# Patient Record
Sex: Female | Born: 1995 | Race: White | Hispanic: No | Marital: Married | Smoking: Never smoker
Health system: Southern US, Community
[De-identification: ages and names within clinical notes are randomized; demographics above are authoritative.]

## PROBLEM LIST (undated history)

## (undated) DIAGNOSIS — F99 Mental disorder, not otherwise specified: Secondary | ICD-10-CM

## (undated) DIAGNOSIS — F419 Anxiety disorder, unspecified: Secondary | ICD-10-CM

## (undated) DIAGNOSIS — F32A Depression, unspecified: Secondary | ICD-10-CM

## (undated) DIAGNOSIS — Z789 Other specified health status: Secondary | ICD-10-CM

## (undated) HISTORY — DX: Other specified health status: Z78.9

## (undated) HISTORY — DX: Anxiety disorder, unspecified: F41.9

## (undated) HISTORY — PX: WISDOM TOOTH EXTRACTION: SHX21

## (undated) HISTORY — DX: Depression, unspecified: F32.A

## (undated) HISTORY — DX: Mental disorder, not otherwise specified: F99

---
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
## 2015-07-24 IMAGING — CR DG WRIST COMPLETE 3+V*L*
4 series · 4 of 4 positions shown · non-contrast
Comparison: None.

CLINICAL DATA: Fall, wrist pain

EXAM:
LEFT WRIST - COMPLETE 3+ VIEW

[x wrist pa left]
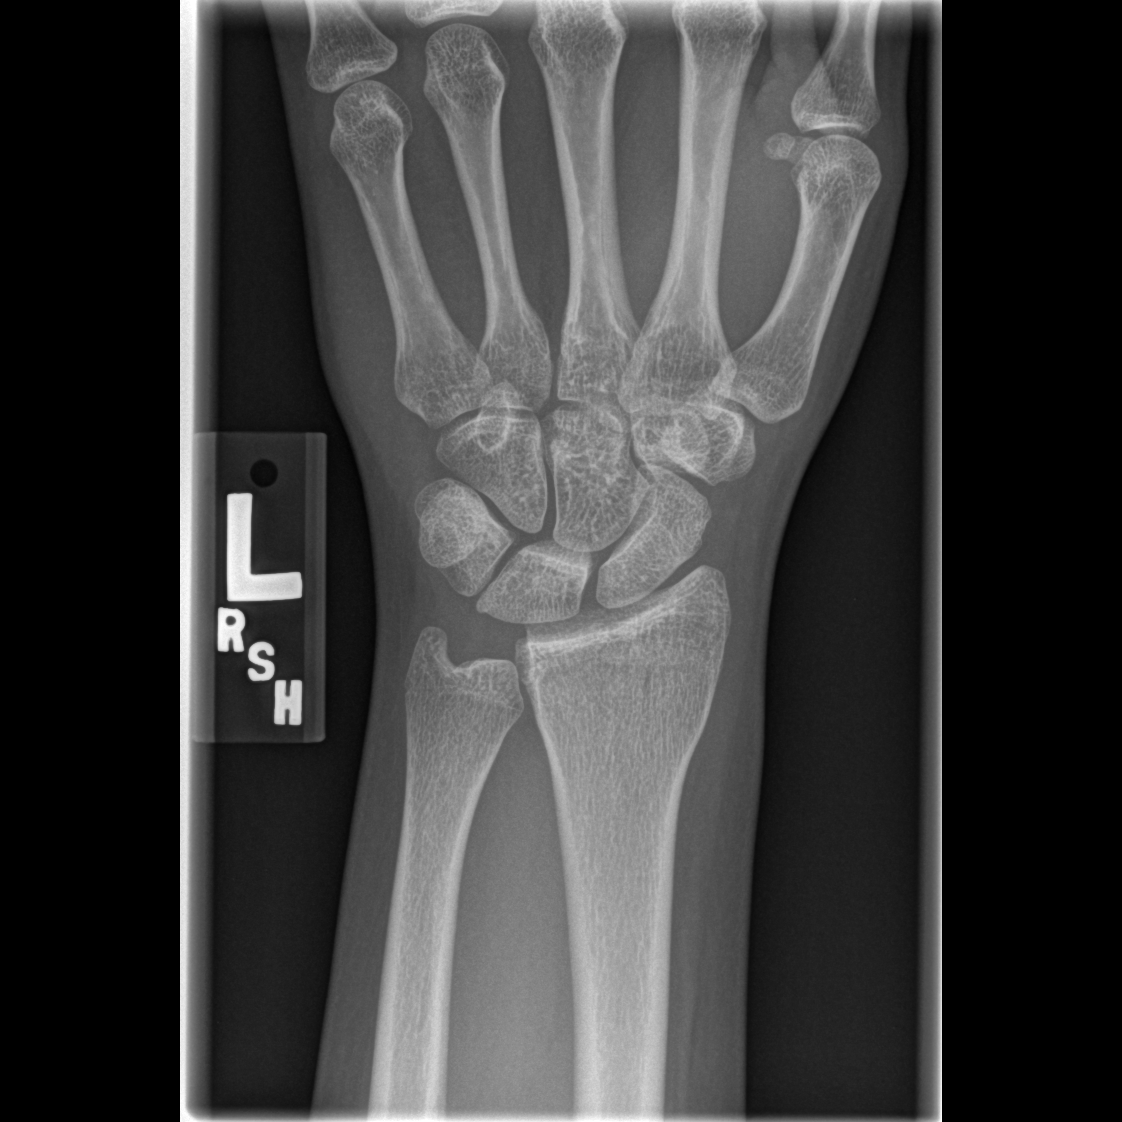

[x wrist obl left]
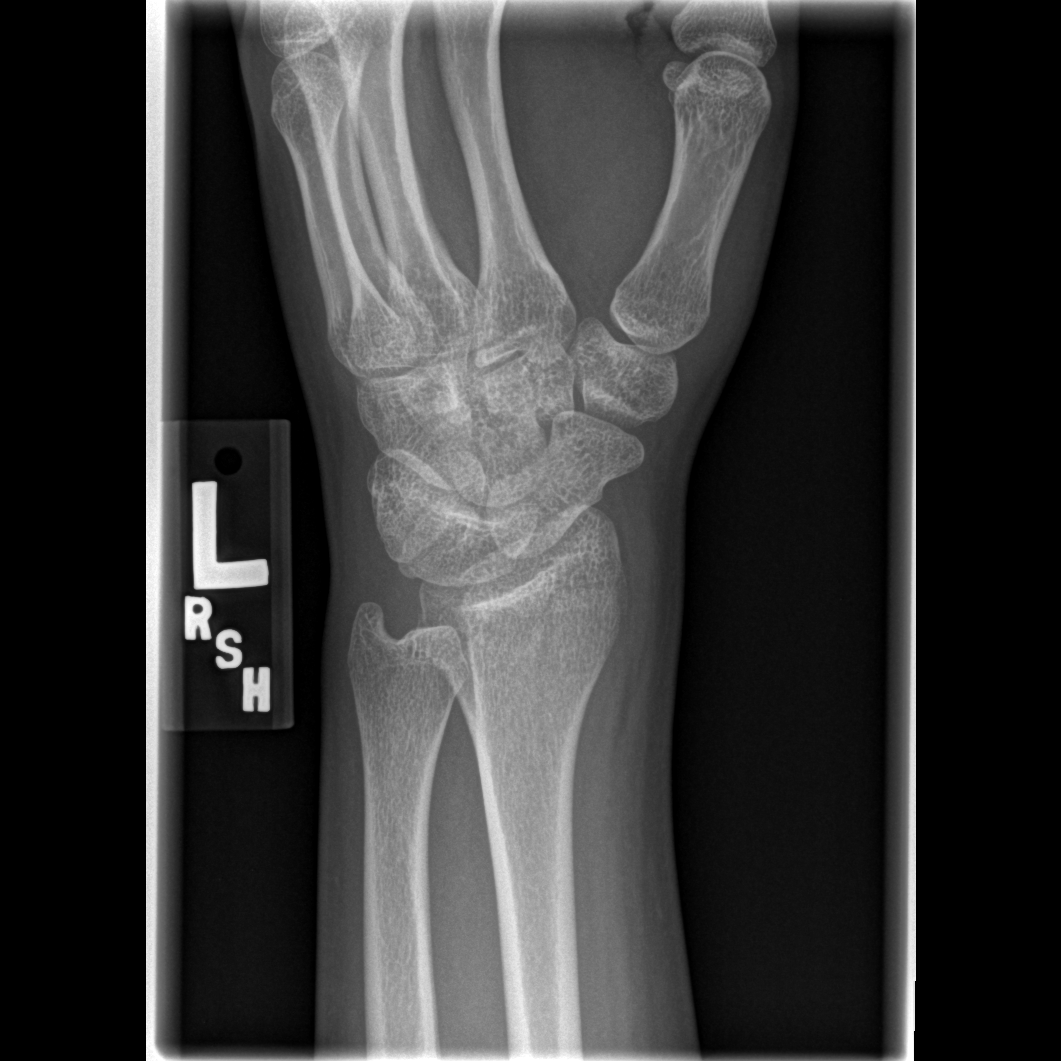

[x wrist lat left]
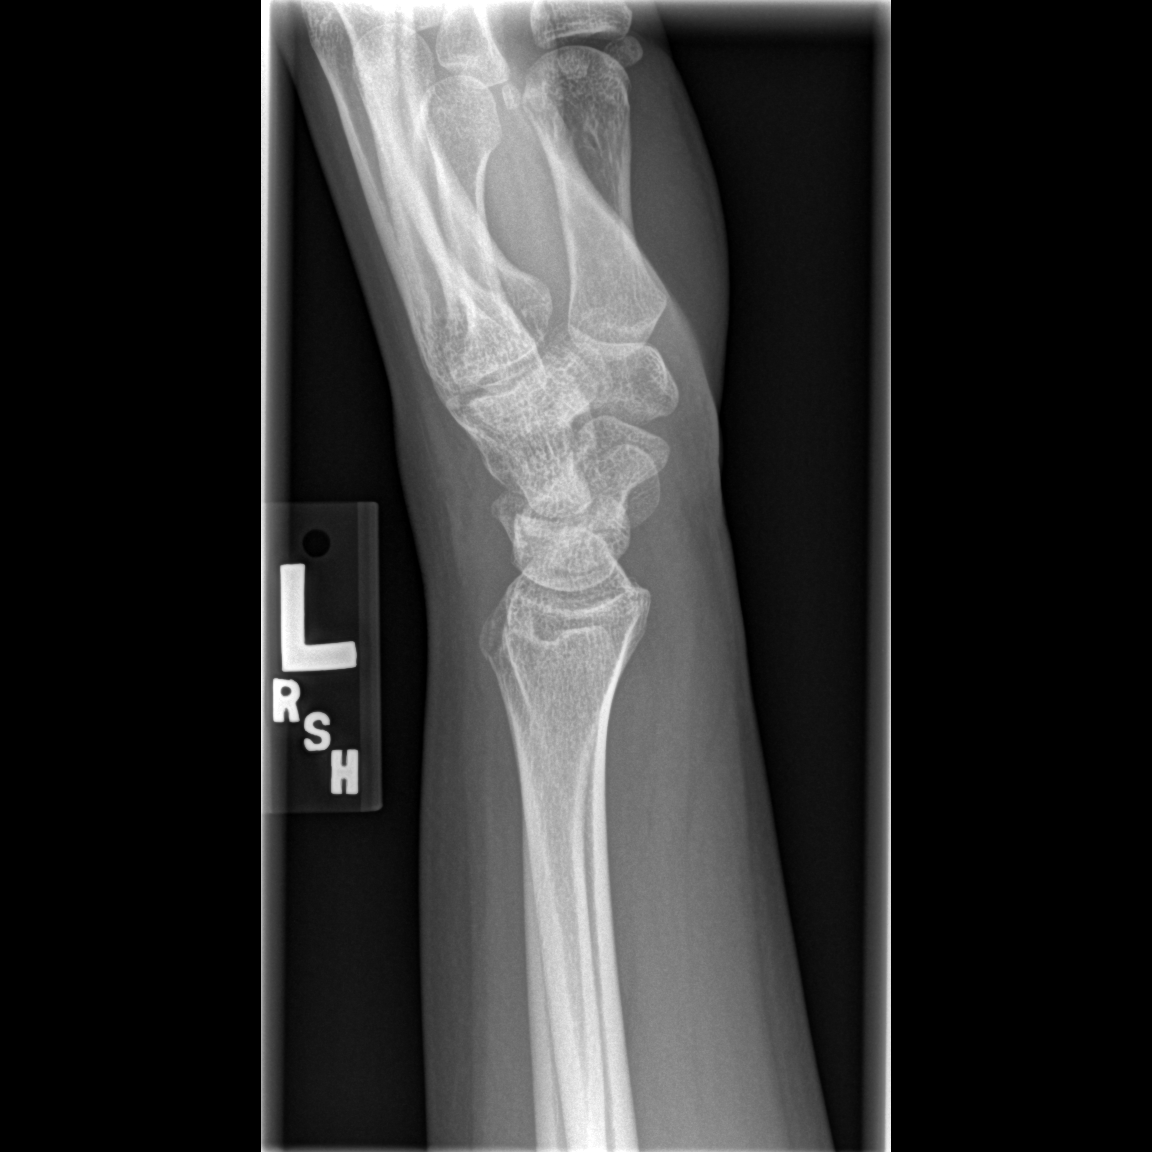

[x navicular]
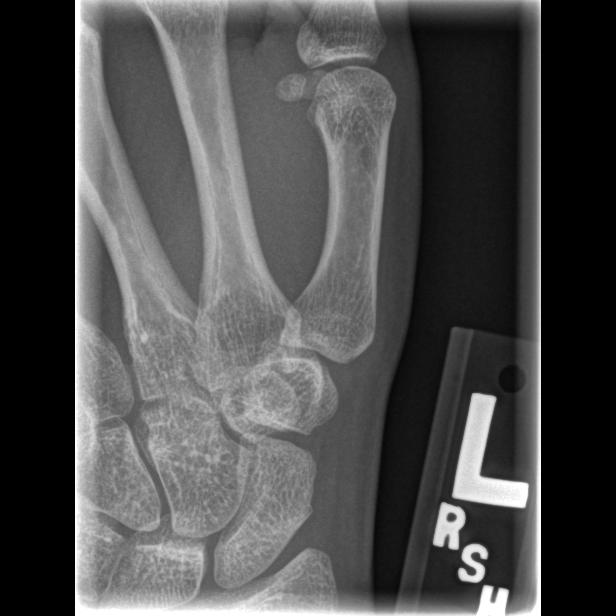

[4 of 4 positions shown; findings below may reference images not displayed]

FINDINGS: Subtle lucency in the distal radius. On the lateral view, there
appears to be disruption of the cortex. High suspicion for
nondisplaced distal radius fracture. There is associated mild soft
tissue swelling with loss of the normal approach [place] quadrant is
fat pad the carpus is intact and congruent. No scaphoid fracture
identified.
IMPRESSION: Subtle, nondisplaced fracture along the dorsal aspect of the distal
radius.

## 2015-11-06 ENCOUNTER — Encounter (HOSPITAL_COMMUNITY): Payer: Self-pay

## 2015-11-06 ENCOUNTER — Emergency Department (HOSPITAL_COMMUNITY)
Admission: EM | Admit: 2015-11-06 | Discharge: 2015-11-06 | Disposition: A | Discharge status: Home / Self Care | Payer: BLUE CROSS/BLUE SHIELD | Attending: Physician Assistant | Admitting: Physician Assistant

## 2015-11-06 DIAGNOSIS — O210 Mild hyperemesis gravidarum: Secondary | ICD-10-CM | POA: Diagnosis not present

## 2015-11-06 DIAGNOSIS — Z3A12 12 weeks gestation of pregnancy: Secondary | ICD-10-CM | POA: Insufficient documentation

## 2015-11-06 DIAGNOSIS — O219 Vomiting of pregnancy, unspecified: Secondary | ICD-10-CM | POA: Diagnosis present

## 2015-11-06 LAB — COMPREHENSIVE METABOLIC PANEL
ALT: 22 U/L (ref 14–54)
ANION GAP: 11 (ref 5–15)
AST: 29 U/L (ref 15–41)
Albumin: 4.1 g/dL (ref 3.5–5.0)
Alkaline Phosphatase: 47 U/L (ref 38–126)
BUN: 5 mg/dL — ABNORMAL LOW (ref 6–20)
CHLORIDE: 107 mmol/L (ref 101–111)
CO2: 16 mmol/L — ABNORMAL LOW (ref 22–32)
Calcium: 9.2 mg/dL (ref 8.9–10.3)
Creatinine, Ser: 0.6 mg/dL (ref 0.44–1.00)
Glucose, Bld: 75 mg/dL (ref 65–99)
POTASSIUM: 3.8 mmol/L (ref 3.5–5.1)
Sodium: 134 mmol/L — ABNORMAL LOW (ref 135–145)
TOTAL PROTEIN: 7.3 g/dL (ref 6.5–8.1)
Total Bilirubin: 1.1 mg/dL (ref 0.3–1.2)

## 2015-11-06 LAB — I-STAT BETA HCG BLOOD, ED (MC, WL, AP ONLY): I-stat hCG, quantitative: >2000 m[IU]/mL — ABNORMAL HIGH (ref <5)

## 2015-11-06 LAB — URINALYSIS, ROUTINE W REFLEX MICROSCOPIC
Bilirubin Urine: NEGATIVE
GLUCOSE, UA: NEGATIVE mg/dL
Hgb urine dipstick: NEGATIVE
Ketones, ur: >80 mg/dL — AB
LEUKOCYTES UA: NEGATIVE
NITRITE: NEGATIVE
PROTEIN: 30 mg/dL — AB
Specific Gravity, Urine: 1.029 (ref 1.005–1.030)
pH: 6 (ref 5.0–8.0)

## 2015-11-06 LAB — URINE MICROSCOPIC-ADD ON: RBC / HPF: NONE SEEN RBC/hpf (ref 0–5)

## 2015-11-06 LAB — CBC
HEMATOCRIT: 38.9 % (ref 36.0–46.0)
Hemoglobin: 14.5 g/dL (ref 12.0–15.0)
MCH: 31 pg (ref 26.0–34.0)
MCHC: 37.3 g/dL — ABNORMAL HIGH (ref 30.0–36.0)
MCV: 83.1 fL (ref 78.0–100.0)
Platelets: 215 10*3/uL (ref 150–400)
RBC: 4.68 MIL/uL (ref 3.87–5.11)
RDW: 12.3 % (ref 11.5–15.5)
WBC: 6.6 10*3/uL (ref 4.0–10.5)

## 2015-11-06 LAB — LIPASE, BLOOD: LIPASE: 24 U/L (ref 11–51)

## 2015-11-06 MED ORDER — LACTATED RINGERS IV BOLUS (SEPSIS)
1000.0000 mL | Freq: Once | INTRAVENOUS | Status: AC
  Administered 2015-11-06: 1000 mL via INTRAVENOUS

## 2015-11-06 MED ORDER — METOCLOPRAMIDE HCL 5 MG/ML IJ SOLN
10.0000 mg | Freq: Once | INTRAMUSCULAR | Status: AC
  Administered 2015-11-06: 10 mg via INTRAVENOUS
  Filled 2015-11-06: qty 2

## 2015-11-06 MED ORDER — DEXTROSE 5 % IN LACTATED RINGERS IV BOLUS
1000.0000 mL | Freq: Once | INTRAVENOUS | Status: AC
  Administered 2015-11-06: 1000 mL via INTRAVENOUS

## 2015-11-06 MED ORDER — FAMOTIDINE IN NACL 20-0.9 MG/50ML-% IV SOLN
20.0000 mg | Freq: Once | INTRAVENOUS | Status: AC
  Administered 2015-11-06: 20 mg via INTRAVENOUS
  Filled 2015-11-06: qty 50

## 2015-11-06 MED ORDER — PROMETHAZINE HCL 25 MG RE SUPP: 25.0000 mg | Freq: Four times a day (QID) | RECTAL | 0 refills | Status: DC | PRN

## 2015-11-06 NOTE — ED Notes (Signed)
Declined W/C at D/C and was escorted to lobby by RN. 

## 2015-11-06 NOTE — ED Notes (Signed)
In Fast Track hallway.

## 2015-11-06 NOTE — Discharge Instructions (Signed)
Use 1/2 suppository to start with for nausea/vomiting. Follow up with your OB, return here as needed.

## 2015-11-06 NOTE — ED Triage Notes (Addendum)
Pt is [redacted] weeks pregnant. Reports vomiting 3-4 x a day for several weeks. OBGYN prescribed nausea meds w/o relief of symptoms. Pt unable to tolerate PO food/fluids, feels lightheaded. Pt told to come to ED for further evaluation by OBGYN. Denies abd pain. Pt states she thinks she may have UTI b/c of burning w/ urination.

## 2015-11-06 NOTE — ED Provider Notes (Signed)
[facility]-EMERGENCY DEPT Provider Note   CSN: [id number] Arrival date & time: 11/06/15  1218     History   Chief Complaint Chief Complaint  Patient presents with  . Morning Sickness    HPI Jacqueline Johnson is a 20 y.o. G1P0 @ 2262w4d gestation who presents to the ED with nausea and vomiting in early pregnancy. Patient reports that she has had n/v since she has been pregnant but symptoms have been worse over the past 3 days. [place]. She has been on Phenergan tablets but vomits them, she has used Diclegis but it doesn't work. She also use OTC nausea medication without relief. Patient has lost 17 pound since she has been pregnant. She had an appointment today but she felt to bad to go. Patient complains of feeling dizzy.  The history is provided by the patient. No language interpreter was used.  Emesis   This is a new problem. The problem occurs 2 to 4 times per day. The emesis has an appearance of stomach contents. There has been no fever. Associated symptoms include cough. Pertinent negatives include no abdominal pain, no fever and no headaches.    History reviewed. No pertinent past medical history.  There are no active problems to display for this patient.   History reviewed. No pertinent surgical history.  OB History    Gravida Para Term Preterm AB Living   1             SAB TAB Ectopic Multiple Live Births                   Home Medications    Prior to Admission medications   Medication Sig Start Date End Date Taking? Authorizing Provider  Norethindrone-Ethinyl Estradiol-Fe Biphas (LO LOESTRIN FE) 1 MG-10 MCG / 10 MCG tablet Take 1 tablet by mouth daily.    Historical Provider, MD  promethazine (PHENERGAN) 25 MG suppository Place 1 suppository (25 mg total) rectally every 6 (six) hours as needed for nausea or vomiting. 11/06/15   Hope Orlene OchM Neese, NP  traMADol (ULTRAM) 50 MG tablet Take 1 tablet (50 mg total) by mouth every 6 (six) hours as  needed. 01/03/13   Emilia BeckKaitlyn Szekalski, PA-C    Family History History reviewed. No pertinent family history.  Social History Social History  Substance Use Topics  . Smoking status: Never Smoker  . Smokeless tobacco: Never Used  . Alcohol use No     Allergies   Review of patient's allergies indicates no known allergies.   Review of Systems Review of Systems  Constitutional: Negative for fever.  HENT: Negative.   Eyes: Negative for visual disturbance.  Respiratory: Positive for cough. Negative for chest tightness.   Cardiovascular: Negative for chest pain.  Gastrointestinal: Positive for vomiting. Negative for abdominal pain.  Genitourinary: Negative for frequency, urgency, vaginal bleeding and vaginal discharge.  Musculoskeletal: Negative for back pain.  Skin: Negative for rash.  Neurological: Positive for light-headedness. Negative for syncope and headaches.  Psychiatric/Behavioral: The patient is not nervous/anxious.      Physical Exam Updated Vital Signs BP 123/79 (BP Location: Right Arm)   Pulse 91   Temp 98.2 F (36.8 C) (Oral)   Resp 16   Ht 5\' 4"  (1.626 m)   Wt 58.1 kg   SpO2 100%   BMI 21.97 kg/m   Physical Exam  Constitutional: She is oriented to person, place, and time. She appears well-developed and well-nourished.  HENT:  Head: Normocephalic  and atraumatic.  Eyes: EOM are normal.  Neck: Normal range of motion. Neck supple.  Cardiovascular: Normal rate.   Pulmonary/Chest: Effort normal.  Abdominal: Soft. Bowel sounds are normal. There is no tenderness.  Positive FHT's  Musculoskeletal: Normal range of motion.  Neurological: She is alert and oriented to person, place, and time. No cranial nerve deficit.  Skin: Skin is warm and dry.  Psychiatric: She has a normal mood and affect. Her behavior is normal.  Nursing note and vitals reviewed.    ED Treatments / Results  Labs (all labs ordered are listed, but only abnormal results are  displayed) Labs Reviewed  COMPREHENSIVE METABOLIC PANEL - Abnormal; Notable for the following:       Result Value   Sodium 134 (*)    CO2 16 (*)    BUN 5 (*)    All other components within normal limits  CBC - Abnormal; Notable for the following:    MCHC 37.3 (*)    All other components within normal limits  URINALYSIS, ROUTINE W REFLEX MICROSCOPIC (NOT AT [facility]) - Abnormal; Notable for the following:    APPearance CLOUDY (*)    Ketones, ur >80 (*)    Protein, ur 30 (*)    All other components within normal limits  URINE MICROSCOPIC-ADD ON - Abnormal; Notable for the following:    Squamous Epithelial / LPF 6-30 (*)    Bacteria, UA RARE (*)    Casts HYALINE CASTS (*)    All other components within normal limits  I-STAT BETA HCG BLOOD, ED ([facility], [facility], [facility] ONLY) - Abnormal; Notable for the following:    I-stat hCG, quantitative >2,000.0 (*)    All other components within normal limits  LIPASE, BLOOD     Radiology No results found.  Procedures Procedures (including critical care time)  Medications Ordered in ED Medications  lactated ringers bolus 1,000 mL (0 mLs Intravenous Stopped 11/06/15 1703)  metoCLOPramide (REGLAN) injection 10 mg (10 mg Intravenous Given 11/06/15 1503)  famotidine (PEPCID) IVPB 20 mg premix (0 mg Intravenous Stopped 11/06/15 1722)  dextrose 5% lactated ringers bolus 1,000 mL (0 mLs Intravenous Stopped 11/06/15 1722)     Initial Impression / Assessment and Plan / ED Course  I have reviewed the triage vital signs and the nursing notes.  Pertinent lab results that were available during my care of the patient were reviewed by me and considered in my medical decision making (see chart for details).  Clinical Course    Final Clinical Impressions(s) / ED Diagnoses  20 y.o. female with n/v in pregnancy stable for d/c without abdominal pain, vaginal bleeding or other problems. Patient feeling much better after IV hydration and medication for nausea. Will give  Rx for phenergan suppositories.  She will f/u with her OB or go to the [facility] if symptoms return.  Final diagnoses:  Hyperemesis gravidarum    New Prescriptions Discharge Medication List as of 11/06/2015  4:56 PM    START taking these medications   Details  promethazine (PHENERGAN) 25 MG suppository Place 1 suppository (25 mg total) rectally every 6 (six) hours as needed for nausea or vomiting., Starting Tue 11/06/2015, Print         Janne Napoleon, NP 11/08/15 1512    Courteney Randall An, MD 11/14/15 [phone]

## 2015-11-06 NOTE — ED Triage Notes (Signed)
Pt drinking a soda and tol. well

## 2016-07-16 ENCOUNTER — Encounter: Payer: Self-pay | Admitting: Podiatry

## 2016-07-16 ENCOUNTER — Ambulatory Visit (INDEPENDENT_AMBULATORY_CARE_PROVIDER_SITE_OTHER): Payer: BLUE CROSS/BLUE SHIELD | Admitting: Podiatry

## 2016-07-16 ENCOUNTER — Telehealth: Payer: Self-pay | Admitting: *Deleted

## 2016-07-16 DIAGNOSIS — L03032 Cellulitis of left toe: Secondary | ICD-10-CM | POA: Diagnosis not present

## 2016-07-16 DIAGNOSIS — L600 Ingrowing nail: Secondary | ICD-10-CM

## 2016-07-16 NOTE — Telephone Encounter (Signed)
Pt states just had an ingrown toenail procedure and wanted to know if she could swim. I told pt that she could not swim until the area had no broken skin, redness, swelling or drainage. Pt states understanding.

## 2016-07-16 NOTE — Progress Notes (Signed)
   Subjective:    Patient ID: Jacqueline Johnson, female    DOB: [birth year], 21 y.o.   MRN: [id number]  HPI this patient presents the office with chief complaint of a painful outside border the big toe left foot. She says this toe has become painful over the last week and is more painful than her right big toe. She gives a history of having an infected ingrown toenail on the right big toe that was treated with antibiotics by her medical doctor and the infection has resolved.  She now presents to this office saying she is pain and discomfort and even pus along the outside border the big toe of the left foot. She presents the office today for definitive evaluation and treatment of this condition    Review of Systems  All other systems reviewed and are negative.      Objective:   Physical Exam GENERAL APPEARANCE: Alert, conversant. Appropriately groomed. No acute distress.  VASCULAR: Pedal pulses are  palpable at  [facility] bilateral.  Capillary refill time is immediate to all digits,  Normal temperature gradient.  Digital hair growth is present bilateral  NEUROLOGIC: sensation is normal to 5.07 monofilament at 5/5 sites bilateral.  Light touch is intact bilateral, Muscle strength normal.  MUSCULOSKELETAL: acceptable muscle strength, tone and stability bilateral.  Intrinsic muscluature intact bilateral.  Rectus appearance of foot and digits noted bilateral.  NAILS  marked incurvation noted on the lateral border of the left great toe with pus noted along the nail groove. No infection in the right hallux except it does have the start of a pincer toenail DERMATOLOGIC: skin color, texture, and turgor are within normal limits.  No preulcerative lesions or ulcers  are seen, no interdigital maceration noted.  No open lesions present.  . No drainage noted.         Assessment & Plan:  Paronychia lateral left great toe.  Ingrown toenail.   IE  Nail surgery.  Treatment options and alternatives discussed.   Recommended permanent phenol matrixectomy and patient agreed.  Left hallux  was prepped with alcohol and a toe block of 3cc of 2% lidocaine plain was administered in a digital toe block. .  The toe was then prepped with betadine solution .  The offending nail border was then excised and matrix tissue exposed.  Phenol was then applied to the matrix tissue followed by an alcohol wash.  Antibiotic ointment and a dry sterile dressing was applied.  The patient was dispensed instructions for aftercare. RTC 1 week.     Helane GuntherGregory Yasuko Lapage DPM

## 2016-07-23 ENCOUNTER — Encounter: Payer: Self-pay | Admitting: Podiatry

## 2016-07-23 ENCOUNTER — Ambulatory Visit (INDEPENDENT_AMBULATORY_CARE_PROVIDER_SITE_OTHER): Payer: BLUE CROSS/BLUE SHIELD | Admitting: Podiatry

## 2016-07-23 DIAGNOSIS — Z09 Encounter for follow-up examination after completed treatment for conditions other than malignant neoplasm: Secondary | ICD-10-CM

## 2016-07-23 NOTE — Progress Notes (Signed)
This patient returns to the office following nail surgery one week ago.  The patient says toe has been soaked and bandaged as directed.  There has been improvement of the toe since the surgery has been performed. The patient presents for continued evaluation and treatment.  GENERAL APPEARANCE: Alert, conversant. Appropriately groomed. No acute distress.  VASCULAR: Pedal pulses palpable at  DP and PT bilateral.  Capillary refill time is immediate to all digits,  Normal temperature gradient.    NEUROLOGIC: sensation is normal to 5.07 monofilament at 5/5 sites bilateral.  Light touch is intact bilateral, Muscle strength normal.  MUSCULOSKELETAL: acceptable muscle strength, tone and stability bilateral.  Intrinsic muscluature intact bilateral.  Rectus appearance of foot and digits noted bilateral.   DERMATOLOGIC: skin color, texture, and turgor are within normal limits.  No preulcerative lesions or ulcers  are seen, no interdigital maceration noted.   NAILS  There is necrotic tissue along the nail groove  In the absence of redness swelling and pain.  DX  S/p nail surgery  ROV  Home instructions were discussed.  Patient to call the office if there are any questions or concerns.   Sharnese Heath DPM   

## 2016-07-29 ENCOUNTER — Ambulatory Visit: Payer: BLUE CROSS/BLUE SHIELD | Admitting: Podiatry

## 2016-09-30 ENCOUNTER — Encounter (HOSPITAL_COMMUNITY): Payer: Self-pay

## 2016-10-01 ENCOUNTER — Ambulatory Visit: Payer: BLUE CROSS/BLUE SHIELD | Admitting: Podiatry

## 2016-10-15 ENCOUNTER — Encounter: Payer: Self-pay | Admitting: Podiatry

## 2016-10-15 ENCOUNTER — Ambulatory Visit (INDEPENDENT_AMBULATORY_CARE_PROVIDER_SITE_OTHER): Payer: BLUE CROSS/BLUE SHIELD | Admitting: Podiatry

## 2016-10-15 DIAGNOSIS — L600 Ingrowing nail: Secondary | ICD-10-CM | POA: Diagnosis not present

## 2016-10-19 NOTE — Progress Notes (Signed)
   Subjective: Patient presents today for evaluation of intermittent pain to the lateral border of the right great toe that began about 4 years ago. She reports associated redness and swelling of the area. Patient is concerned for possible ingrown nail. Patient presents today for further treatment and evaluation.   No past medical history on file.    Objective:  General: Well developed, nourished, in no acute distress, alert and oriented x3   Dermatology: Skin is warm, dry and supple bilateral. Lateral border of the right great toe appears to be erythematous with evidence of an ingrowing nail. Pain on palpation noted to the border of the nail fold. The remaining nails appear unremarkable at this time. There are no open sores, lesions.  Vascular: Dorsalis Pedis artery and Posterior Tibial artery pedal pulses palpable. No lower extremity edema noted.   Neruologic: Grossly intact via light touch bilateral.  Musculoskeletal: Muscular strength within normal limits in all groups bilateral. Normal range of motion noted to all pedal and ankle joints.   Assesement: #1 Paronychia with ingrowing nail lateral border of the right great toe #2 Pain in toe #3 Incurvated nail  Plan of Care:  1. Patient evaluated.  2. Discussed treatment alternatives and plan of care. Explained nail avulsion procedure and post procedure course to patient. 3. Patient opted for permanent partial nail avulsion.  4. Prior to procedure, local anesthesia infiltration utilized using 3 ml of a 50:50 mixture of 2% plain lidocaine and 0.5% plain marcaine in a normal hallux block fashion and a betadine prep performed.  5. Partial permanent nail avulsion with chemical matrixectomy performed using 3x30sec applications of phenol followed by alcohol flush.  6. Light dressing applied. 7. Return to clinic in 2 weeks.   Felecia Shelling, DPM [facility]  Dr. Felecia Shelling, DPM    [address]                                         [place], [place] [address]                Office [phone]  Fax [phone]

## 2016-10-29 ENCOUNTER — Ambulatory Visit (INDEPENDENT_AMBULATORY_CARE_PROVIDER_SITE_OTHER): Payer: BLUE CROSS/BLUE SHIELD | Admitting: Podiatry

## 2016-10-29 DIAGNOSIS — L600 Ingrowing nail: Secondary | ICD-10-CM | POA: Diagnosis not present

## 2016-10-29 MED ORDER — GENTAMICIN SULFATE 0.1 % EX CREA: 1.0000 "application " | TOPICAL_CREAM | Freq: Three times a day (TID) | CUTANEOUS | 1 refills | Status: DC

## 2016-11-01 NOTE — Progress Notes (Signed)
   Subjective: Patient presents today 2 weeks post ingrown nail permanent nail avulsion procedure. Patient states that the toe and nail fold is feeling much better.  No past medical history on file.  Objective: Skin is warm, dry and supple. Nail and respective nail fold appears to be healing appropriately. Open wound to the associated nail fold with a granular wound base and moderate amount of fibrotic tissue. Minimal drainage noted. Mild erythema around the periungual region likely due to phenol chemical matricectomy.  Assessment: #1 postop permanent partial nail avulsion lateral border right great toe #2 open wound periungual nail fold of respective digit.   Plan of care: #1 patient was evaluated  #2 debridement of open wound was performed to the periungual border of the respective toe using a currette. Antibiotic ointment and Band-Aid was applied. #3 prescription for gentamicin cream given to patient. #4 patient is to return to clinic on a PRN  basis.   Felecia Shelling, DPM [facility]  Dr. Felecia Shelling, DPM    [address]                                        [place], [place] [address]                Office [phone]  Fax [phone]

## 2018-01-27 NOTE — L&D Delivery Note (Signed)
OB/GYN Faculty Practice Delivery Note  Taneka Espiritu is a 23 y.o. G2P1001 s/p NSVD at [redacted]w[redacted]d. She was admitted for SOL.   ROM: 3h 62m with meconium-stained fluid GBS Status: negative Maximum Maternal Temperature: 98.42F  Labor Progress: . Patient came in contracting and making cervical change spontaneously. She was admitted to labor and delivery and SROMed at 1145 hours with moderate meconium-stained fluid. She progressed to complete with pitocin augmentation.  Delivery Date/Time: 11/29/18, 1534 hours Delivery: Called to room and patient was complete and pushing. Head delivered ROA. There were two nuchal cords which were manually reduced, and a body cord which was delivered through. Shoulder and body delivered in usual fashion. Infant with spontaneous cry, placed on mother's abdomen, dried and stimulated. Cord clamped x 2 after 1-minute delay, and cut by FOB under my direct supervision. Cord blood drawn. Placenta delivered spontaneously with gentle cord traction. Fundus firm with massage and Pitocin. Labia, perineum, vagina, and cervix were inspected, and a left labial abrasion was noted (hemostatic and did not require repair).   Placenta: 3 vessel cord, intact Complications: none Lacerations: left labial abrasion, hemostatic EBL: 100 mL Analgesia: epidural  Postpartum Planning [x]  message to sent to schedule follow-up  [x]  vaccines UTD  Infant: female  APGARs 9&9  weight per medical record  Merilyn Baba, DO OB/GYN Fellow, Faculty Practice

## 2018-04-05 ENCOUNTER — Encounter: Payer: Self-pay | Admitting: Adult Health

## 2018-04-05 ENCOUNTER — Ambulatory Visit (INDEPENDENT_AMBULATORY_CARE_PROVIDER_SITE_OTHER): Payer: BLUE CROSS/BLUE SHIELD | Admitting: Adult Health

## 2018-04-05 VITALS — BP 104/56 | HR 94 | Ht 65.0 in | Wt 143.0 lb

## 2018-04-05 DIAGNOSIS — Z3201 Encounter for pregnancy test, result positive: Secondary | ICD-10-CM

## 2018-04-05 DIAGNOSIS — R110 Nausea: Secondary | ICD-10-CM

## 2018-04-05 DIAGNOSIS — O3680X0 Pregnancy with inconclusive fetal viability, not applicable or unspecified: Secondary | ICD-10-CM | POA: Insufficient documentation

## 2018-04-05 DIAGNOSIS — Z3A01 Less than 8 weeks gestation of pregnancy: Secondary | ICD-10-CM | POA: Insufficient documentation

## 2018-04-05 LAB — POCT URINE PREGNANCY: PREG TEST UR: POSITIVE — AB

## 2018-04-05 MED ORDER — PROMETHAZINE HCL 25 MG PO TABS: 25.0000 mg | ORAL_TABLET | Freq: Four times a day (QID) | ORAL | 2 refills | Status: DC | PRN

## 2018-04-05 NOTE — Patient Instructions (Signed)
First Trimester of Pregnancy  The first trimester of pregnancy is from week 1 until the end of week 13 (months 1 through 3). A week after a sperm fertilizes an egg, the egg will implant on the wall of the uterus. This embryo will begin to develop into a baby. Genes from you and your partner will form the baby. The female genes will determine whether the baby will be a boy or a girl. At 6-8 weeks, the eyes and face will be formed, and the heartbeat can be seen on ultrasound. At the end of 12 weeks, all the baby's organs will be formed.  Now that you are pregnant, you will want to do everything you can to have a healthy baby. Two of the most important things are to get good prenatal care and to follow your health care provider's instructions. Prenatal care is all the medical care you receive before the baby's birth. This care will help prevent, find, and treat any problems during the pregnancy and childbirth.  Body changes during your first trimester  Your body goes through many changes during pregnancy. The changes vary from woman to woman.   You may gain or lose a couple of pounds at first.   You may feel sick to your stomach (nauseous) and you may throw up (vomit). If the vomiting is uncontrollable, call your health care provider.   You may tire easily.   You may develop headaches that can be relieved by medicines. All medicines should be approved by your health care provider.   You may urinate more often. Painful urination may mean you have a bladder infection.   You may develop heartburn as a result of your pregnancy.   You may develop constipation because certain hormones are causing the muscles that push stool through your intestines to slow down.   You may develop hemorrhoids or swollen veins (varicose veins).   Your breasts may begin to grow larger and become tender. Your nipples may stick out more, and the tissue that surrounds them (areola) may become darker.   Your gums may bleed and may be  sensitive to brushing and flossing.   Dark spots or blotches (chloasma, mask of pregnancy) may develop on your face. This will likely fade after the baby is born.   Your menstrual periods will stop.   You may have a loss of appetite.   You may develop cravings for certain kinds of food.   You may have changes in your emotions from day to day, such as being excited to be pregnant or being concerned that something may go wrong with the pregnancy and baby.   You may have more vivid and strange dreams.   You may have changes in your hair. These can include thickening of your hair, rapid growth, and changes in texture. Some women also have hair loss during or after pregnancy, or hair that feels dry or thin. Your hair will most likely return to normal after your baby is born.  What to expect at prenatal visits  During a routine prenatal visit:   You will be weighed to make sure you and the baby are growing normally.   Your blood pressure will be taken.   Your abdomen will be measured to track your baby's growth.   The fetal heartbeat will be listened to between weeks 10 and 14 of your pregnancy.   Test results from any previous visits will be discussed.  Your health care provider may ask you:     How you are feeling.   If you are feeling the baby move.   If you have had any abnormal symptoms, such as leaking fluid, bleeding, severe headaches, or abdominal cramping.   If you are using any tobacco products, including cigarettes, chewing tobacco, and electronic cigarettes.   If you have any questions.  Other tests that may be performed during your first trimester include:   Blood tests to find your blood type and to check for the presence of any previous infections. The tests will also be used to check for low iron levels (anemia) and protein on red blood cells (Rh antibodies). Depending on your risk factors, or if you previously had diabetes during pregnancy, you may have tests to check for high blood sugar  that affects pregnant women (gestational diabetes).   Urine tests to check for infections, diabetes, or protein in the urine.   An ultrasound to confirm the proper growth and development of the baby.   Fetal screens for spinal cord problems (spina bifida) and Down syndrome.   HIV (human immunodeficiency virus) testing. Routine prenatal testing includes screening for HIV, unless you choose not to have this test.   You may need other tests to make sure you and the baby are doing well.  Follow these instructions at home:  Medicines   Follow your health care provider's instructions regarding medicine use. Specific medicines may be either safe or unsafe to take during pregnancy.   Take a prenatal vitamin that contains at least 600 micrograms (mcg) of folic acid.   If you develop constipation, try taking a stool softener if your health care provider approves.  Eating and drinking     Eat a balanced diet that includes fresh fruits and vegetables, whole grains, good sources of protein such as meat, eggs, or tofu, and low-fat dairy. Your health care provider will help you determine the amount of weight gain that is right for you.   Avoid raw meat and uncooked cheese. These carry germs that can cause birth defects in the baby.   Eating four or five small meals rather than three large meals a day may help relieve nausea and vomiting. If you start to feel nauseous, eating a few soda crackers can be helpful. Drinking liquids between meals, instead of during meals, also seems to help ease nausea and vomiting.   Limit foods that are high in fat and processed sugars, such as fried and sweet foods.   To prevent constipation:  ? Eat foods that are high in fiber, such as fresh fruits and vegetables, whole grains, and beans.  ? Drink enough fluid to keep your urine clear or pale yellow.  Activity   Exercise only as directed by your health care provider. Most women can continue their usual exercise routine during  pregnancy. Try to exercise for 30 minutes at least 5 days a week. Exercising will help you:  ? Control your weight.  ? Stay in shape.  ? Be prepared for labor and delivery.   Experiencing pain or cramping in the lower abdomen or lower back is a good sign that you should stop exercising. Check with your health care provider before continuing with normal exercises.   Try to avoid standing for long periods of time. Move your legs often if you must stand in one place for a long time.   Avoid heavy lifting.   Wear low-heeled shoes and practice good posture.   You may continue to have sex unless your health care   provider tells you not to.  Relieving pain and discomfort   Wear a good support bra to relieve breast tenderness.   Take warm sitz baths to soothe any pain or discomfort caused by hemorrhoids. Use hemorrhoid cream if your health care provider approves.   Rest with your legs elevated if you have leg cramps or low back pain.   If you develop varicose veins in your legs, wear support hose. Elevate your feet for 15 minutes, 3-4 times a day. Limit salt in your diet.  Prenatal care   Schedule your prenatal visits by the twelfth week of pregnancy. They are usually scheduled monthly at first, then more often in the last 2 months before delivery.   Write down your questions. Take them to your prenatal visits.   Keep all your prenatal visits as told by your health care provider. This is important.  Safety   Wear your seat belt at all times when driving.   Make a list of emergency phone numbers, including numbers for family, friends, the hospital, and police and fire departments.  General instructions   Ask your health care provider for a referral to a local prenatal education class. Begin classes no later than the beginning of month 6 of your pregnancy.   Ask for help if you have counseling or nutritional needs during pregnancy. Your health care provider can offer advice or refer you to specialists for help  with various needs.   Do not use hot tubs, steam rooms, or saunas.   Do not douche or use tampons or scented sanitary pads.   Do not cross your legs for long periods of time.   Avoid cat litter boxes and soil used by cats. These carry germs that can cause birth defects in the baby and possibly loss of the fetus by miscarriage or stillbirth.   Avoid all smoking, herbs, alcohol, and medicines not prescribed by your health care provider. Chemicals in these products affect the formation and growth of the baby.   Do not use any products that contain nicotine or tobacco, such as cigarettes and e-cigarettes. If you need help quitting, ask your health care provider. You may receive counseling support and other resources to help you quit.   Schedule a dentist appointment. At home, brush your teeth with a soft toothbrush and be gentle when you floss.  Contact a health care provider if:   You have dizziness.   You have mild pelvic cramps, pelvic pressure, or nagging pain in the abdominal area.   You have persistent nausea, vomiting, or diarrhea.   You have a bad smelling vaginal discharge.   You have pain when you urinate.   You notice increased swelling in your face, hands, legs, or ankles.   You are exposed to fifth disease or chickenpox.   You are exposed to German measles (rubella) and have never had it.  Get help right away if:   You have a fever.   You are leaking fluid from your vagina.   You have spotting or bleeding from your vagina.   You have severe abdominal cramping or pain.   You have rapid weight gain or loss.   You vomit blood or material that looks like coffee grounds.   You develop a severe headache.   You have shortness of breath.   You have any kind of trauma, such as from a fall or a car accident.  Summary   The first trimester of pregnancy is from week 1 until   the end of week 13 (months 1 through 3).   Your body goes through many changes during pregnancy. The changes vary from  woman to woman.   You will have routine prenatal visits. During those visits, your health care provider will examine you, discuss any test results you may have, and talk with you about how you are feeling.  This information is not intended to replace advice given to you by your health care provider. Make sure you discuss any questions you have with your health care provider.  Document Released: 01/07/2001 Document Revised: 12/26/2015 Document Reviewed: 12/26/2015  Elsevier Interactive Patient Education  2019 [employer]

## 2018-04-05 NOTE — Progress Notes (Signed)
Patient ID: Jacqueline Johnson, female   DOB: 1995-02-07, 23 y.o.   MRN: [id number] History of Present Illness:  Jacqueline Johnson is a 23 year old white female, married in for UPT, has missed a period and had 2+HPTs. She works as Lawyer at [employer]. PCP is Dr Benedetto Goad.   Current Medications, Allergies, Past Medical History, Past Surgical History, Family History and Social History were reviewed in [employer] record.     Review of Systems: +missed period with 2+HPTs +nausea +tired +short of breath at times   Physical Exam:BP (!) 104/56 (BP Location: Left Arm, Patient Position: Sitting, Cuff Size: Normal)   Pulse 94   Ht 5\' 5"  (1.651 m)   Wt 143 lb (64.9 kg)   LMP 02/10/2018   BMI 23.80 kg/m   UPT +, about 7+4 weeks by LMP with EDD 11/18/2018. General:  Well developed, well nourished, no acute distress Skin:  Warm and dry Neck:  Midline trachea, normal thyroid, good ROM, no lymphadenopathy Lungs; Clear to auscultation bilaterally Cardiovascular: Regular rate and rhythm Abdomen:  Soft, non tender Psych:  No mood changes, alert and cooperative,seems happy Fall risk is low. PHQ 2 score 0.   Impression:  1. Pregnancy examination or test, positive result   2. Less than [redacted] weeks gestation of pregnancy   3. Encounter to determine fetal viability of pregnancy, single or unspecified fetus   4. Nausea      Plan: Meds ordered this encounter  Medications  . promethazine (PHENERGAN) 25 MG tablet    Sig: Take 1 tablet (25 mg total) by mouth every 6 (six) hours as needed for nausea or vomiting.    Dispense:  30 tablet    Refill:  2    Order Specific Question:   Supervising Provider    Answer:   Duane Lope H [[id number]]  Return in 1 week for dating US/3 weeks new OB Review handouts on First trimester and by [facility]  Eat often.

## 2018-04-12 ENCOUNTER — Other Ambulatory Visit: Payer: Self-pay | Admitting: Obstetrics & Gynecology

## 2018-04-12 DIAGNOSIS — O3680X0 Pregnancy with inconclusive fetal viability, not applicable or unspecified: Secondary | ICD-10-CM

## 2018-04-13 ENCOUNTER — Other Ambulatory Visit: Payer: Self-pay

## 2018-04-13 ENCOUNTER — Ambulatory Visit (INDEPENDENT_AMBULATORY_CARE_PROVIDER_SITE_OTHER): Payer: BLUE CROSS/BLUE SHIELD

## 2018-04-13 DIAGNOSIS — O3680X0 Pregnancy with inconclusive fetal viability, not applicable or unspecified: Secondary | ICD-10-CM | POA: Diagnosis not present

## 2018-04-13 DIAGNOSIS — Z3A08 8 weeks gestation of pregnancy: Secondary | ICD-10-CM

## 2018-04-13 NOTE — Progress Notes (Signed)
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

## 2018-04-30 ENCOUNTER — Encounter: Payer: BLUE CROSS/BLUE SHIELD | Admitting: Women's Health

## 2018-04-30 ENCOUNTER — Ambulatory Visit: Payer: BLUE CROSS/BLUE SHIELD | Admitting: *Deleted

## 2018-05-21 ENCOUNTER — Ambulatory Visit: Payer: BLUE CROSS/BLUE SHIELD | Admitting: *Deleted

## 2018-05-21 ENCOUNTER — Other Ambulatory Visit: Payer: Self-pay | Admitting: Obstetrics & Gynecology

## 2018-05-21 ENCOUNTER — Other Ambulatory Visit: Payer: BLUE CROSS/BLUE SHIELD

## 2018-05-21 ENCOUNTER — Encounter: Payer: BLUE CROSS/BLUE SHIELD | Admitting: Advanced Practice Midwife

## 2018-05-21 DIAGNOSIS — Z3682 Encounter for antenatal screening for nuchal translucency: Secondary | ICD-10-CM

## 2018-05-24 ENCOUNTER — Ambulatory Visit: Payer: BLUE CROSS/BLUE SHIELD | Admitting: *Deleted

## 2018-05-24 ENCOUNTER — Other Ambulatory Visit: Payer: Self-pay

## 2018-05-24 ENCOUNTER — Other Ambulatory Visit: Payer: BLUE CROSS/BLUE SHIELD

## 2018-05-24 ENCOUNTER — Ambulatory Visit (INDEPENDENT_AMBULATORY_CARE_PROVIDER_SITE_OTHER): Payer: BLUE CROSS/BLUE SHIELD

## 2018-05-24 DIAGNOSIS — Z3482 Encounter for supervision of other normal pregnancy, second trimester: Secondary | ICD-10-CM

## 2018-05-24 DIAGNOSIS — Z1379 Encounter for other screening for genetic and chromosomal anomalies: Secondary | ICD-10-CM

## 2018-05-24 DIAGNOSIS — Z3A13 13 weeks gestation of pregnancy: Secondary | ICD-10-CM

## 2018-05-24 DIAGNOSIS — Z3682 Encounter for antenatal screening for nuchal translucency: Secondary | ICD-10-CM | POA: Diagnosis not present

## 2018-05-24 DIAGNOSIS — Z3490 Encounter for supervision of normal pregnancy, unspecified, unspecified trimester: Secondary | ICD-10-CM | POA: Insufficient documentation

## 2018-05-24 NOTE — Progress Notes (Signed)
[place] 13+3 wks,measurements c/w dates,posterior placenta gr 0,posterior fibroid 1.9 x 1.7 x 1.6 cm,normal ovaries bilat,crl 73.03 mm,fhr 158 bpm,NB present ,NT 2.4 mm

## 2018-05-25 LAB — PMP SCREEN PROFILE (10S), URINE
Amphetamine Scrn, Ur: NEGATIVE ng/mL
BARBITURATE SCREEN URINE: NEGATIVE ng/mL
BENZODIAZEPINE SCREEN, URINE: NEGATIVE ng/mL
CANNABINOIDS UR QL SCN: NEGATIVE ng/mL
Cocaine (Metab) Scrn, Ur: NEGATIVE ng/mL
Creatinine(Crt), U: 40.8 mg/dL (ref 20.0–300.0)
Methadone Screen, Urine: NEGATIVE ng/mL
OXYCODONE+OXYMORPHONE UR QL SCN: NEGATIVE ng/mL
Opiate Scrn, Ur: NEGATIVE ng/mL
Ph of Urine: 7.1 (ref 4.5–8.9)
Phencyclidine Qn, Ur: NEGATIVE ng/mL
Propoxyphene Scrn, Ur: NEGATIVE ng/mL

## 2018-05-25 LAB — MED LIST OPTION NOT SELECTED

## 2018-05-26 LAB — OBSTETRIC PANEL, INCLUDING HIV
Antibody Screen: NEGATIVE
Basophils Absolute: 0 10*3/uL (ref 0.0–0.2)
Basos: 0 %
EOS (ABSOLUTE): 0 10*3/uL (ref 0.0–0.4)
Eos: 0 %
HIV Screen 4th Generation wRfx: NONREACTIVE
Hematocrit: 39 % (ref 34.0–46.6)
Hemoglobin: 13 g/dL (ref 11.1–15.9)
Hepatitis B Surface Ag: NEGATIVE
Immature Grans (Abs): 0 10*3/uL (ref 0.0–0.1)
Immature Granulocytes: 0 %
Lymphocytes Absolute: 1.5 10*3/uL (ref 0.7–3.1)
Lymphs: 21 %
MCH: 30.6 pg (ref 26.6–33.0)
MCHC: 33.3 g/dL (ref 31.5–35.7)
MCV: 92 fL (ref 79–97)
Monocytes Absolute: 0.4 10*3/uL (ref 0.1–0.9)
Monocytes: 6 %
Neutrophils Absolute: 5.5 10*3/uL (ref 1.4–7.0)
Neutrophils: 73 %
Platelets: 213 10*3/uL (ref 150–450)
RBC: 4.25 x10E6/uL (ref 3.77–5.28)
RDW: 13.2 % (ref 11.7–15.4)
RPR Ser Ql: NONREACTIVE
Rh Factor: NEGATIVE
Rubella Antibodies, IGG: 2.51 index (ref >0.99)
WBC: 7.5 10*3/uL (ref 3.4–10.8)

## 2018-05-26 LAB — INTEGRATED 1
Crown Rump Length: 73 mm
Gest. Age on Collection Date: 13.3 weeks
Maternal Age at EDD: 24.3 yr
Nuchal Translucency (NT): 2.4 mm
Number of Fetuses: 1
PAPP-A Value: 2501.8 ng/mL
Weight: 137 [lb_av]

## 2018-05-26 LAB — URINALYSIS, ROUTINE W REFLEX MICROSCOPIC
Bilirubin, UA: NEGATIVE
Glucose, UA: NEGATIVE
Ketones, UA: NEGATIVE
Leukocytes,UA: NEGATIVE
Nitrite, UA: NEGATIVE
Protein,UA: NEGATIVE
RBC, UA: NEGATIVE
Specific Gravity, UA: 1.009 (ref 1.005–1.030)
Urobilinogen, Ur: 0.2 mg/dL (ref 0.2–1.0)
pH, UA: 7.5 (ref 5.0–7.5)

## 2018-05-26 LAB — GC/CHLAMYDIA PROBE AMP
Chlamydia trachomatis, NAA: NEGATIVE
Neisseria Gonorrhoeae by PCR: NEGATIVE

## 2018-05-26 LAB — URINE CULTURE: Organism ID, Bacteria: NO GROWTH

## 2018-05-26 LAB — SICKLE CELL SCREEN: Sickle Cell Screen: NEGATIVE

## 2018-05-28 ENCOUNTER — Telehealth: Payer: Self-pay | Admitting: *Deleted

## 2018-05-28 NOTE — Telephone Encounter (Signed)
Patient informed that we are not allowing visitors or children to come to appointments at this time. Patient denies any contact with anyone suspected or confirmed of having COVID-19. Pt denies fever, cough, sob, muscle pain, diarrhea, rash, vomiting, abdominal pain, red eye, weakness, bruising or bleeding, joint pain or severe headache.  

## 2018-05-31 ENCOUNTER — Ambulatory Visit (INDEPENDENT_AMBULATORY_CARE_PROVIDER_SITE_OTHER): Payer: BLUE CROSS/BLUE SHIELD | Admitting: Advanced Practice Midwife

## 2018-05-31 ENCOUNTER — Encounter: Payer: Self-pay | Admitting: Advanced Practice Midwife

## 2018-05-31 ENCOUNTER — Other Ambulatory Visit: Payer: Self-pay

## 2018-05-31 ENCOUNTER — Ambulatory Visit: Payer: BLUE CROSS/BLUE SHIELD | Admitting: *Deleted

## 2018-05-31 VITALS — BP 94/59 | HR 100 | Wt 139.5 lb

## 2018-05-31 DIAGNOSIS — Z363 Encounter for antenatal screening for malformations: Secondary | ICD-10-CM

## 2018-05-31 DIAGNOSIS — Z331 Pregnant state, incidental: Secondary | ICD-10-CM

## 2018-05-31 DIAGNOSIS — Z3482 Encounter for supervision of other normal pregnancy, second trimester: Secondary | ICD-10-CM | POA: Diagnosis not present

## 2018-05-31 DIAGNOSIS — Z3A14 14 weeks gestation of pregnancy: Secondary | ICD-10-CM | POA: Diagnosis not present

## 2018-05-31 DIAGNOSIS — Z1389 Encounter for screening for other disorder: Secondary | ICD-10-CM

## 2018-05-31 LAB — POCT URINALYSIS DIPSTICK OB
Glucose, UA: NEGATIVE
Leukocytes, UA: NEGATIVE
Nitrite, UA: NEGATIVE
POC,PROTEIN,UA: NEGATIVE

## 2018-05-31 NOTE — Progress Notes (Signed)
INITIAL OBSTETRICAL VISIT Patient name: Jacqueline Johnson MRN [id number]  Date of birth: 1995-03-11 Chief Complaint:   Initial Prenatal Visit  History of Present Illness:   Jacqueline Johnson is a 23 y.o. 892P1001 Caucasian female at 6442w3d by 7 week US with an Estimated Date of Delivery: 11/26/18 being seen today for her initial obstetrical visit.   Her obstetrical history is significant for term SVD w/o problems.   Today she reports feeling fine, has some ptyalism (had hyperemesis/ptyalism last pg, not as bad w/this one).  Patient's last menstrual period was 02/10/2018. Last pap 2 years ago. Results were: normal Review of Systems:   Pertinent items are noted in HPI Denies cramping/contractions, leakage of fluid, vaginal bleeding, abnormal vaginal discharge w/ itching/odor/irritation, headaches, visual changes, shortness of breath, chest pain, abdominal pain, severe nausea/vomiting, or problems with urination or bowel movements unless otherwise stated above.  Pertinent History Reviewed:  Reviewed past medical,surgical, social, obstetrical and family history.  Reviewed problem list, medications and allergies. OB History  Gravida Para Term Preterm AB Living  2 1 1  0 0 1  SAB TAB Ectopic Multiple Live Births  0 0 0   1    # Outcome Date GA Lbr Len/2nd Weight Sex Delivery Anes PTL Lv  2 Current           1 Term 05/09/16 4566w0d  7 lb 1 oz (3.204 kg) F Vag-Spont None N LIV   Physical Assessment:   Vitals:   05/31/18 1506  BP: (!) 94/59  Pulse: 100  Weight: 139 lb 8 oz (63.3 kg)  Body mass index is 23.21 kg/m.       Physical Examination:  General appearance - well appearing, and in no distress  Mental status - alert, oriented to person, place, and time  Psych:  She has a normal mood and affect  Skin - warm and dry, normal color, no suspicious lesions noted  Chest - effort normal, all lung fields clear to auscultation bilaterally  Heart - normal rate and regular rhythm  Abdomen - soft,  nontender  Extremities:  No swelling or varicosities noted   Fetal Heart Rate (bpm): 159 via doppler  Results for orders placed or performed in visit on 05/31/18 (from the past 24 hour(s))  POC Urinalysis Dipstick OB   Collection Time: 05/31/18  3:19 PM  Result Value Ref Range   Color, UA     Clarity, UA     Glucose, UA Negative Negative   Bilirubin, UA     Ketones, UA small    Spec Grav, UA     Blood, UA trace    pH, UA     POC,PROTEIN,UA Negative Negative, Trace, Small (1+), Moderate (2+), Large (3+), 4+   Urobilinogen, UA     Nitrite, UA neg    Leukocytes, UA Negative Negative   Appearance     Odor      Assessment & Plan:  1) Low-Risk Pregnancy G2P1001 at 8842w3d with an Estimated Date of Delivery: 11/26/18   2) Initial OB visit   Meds: No orders of the defined types were placed in this encounter.   Initial labs obtained earlier this week Continue prenatal vitamins Reviewed n/v relief measures and warning s/s to report Reviewed recommended weight gain based on pre-gravid BMI Encouraged well-balanced diet Watched video for carrier screening/genetic testing:  Genetic Screening discussed Integrated Screen: requested Cystic fibrosis screening declined SMA screening declined Fragile X screening declined Ultrasound discussed; fetal survey: declined CCNC completed  Follow-up: Return  in about 4 weeks (around 06/28/2018) for LROB, VV:OHYWVPX.   Orders Placed This Encounter  Procedures  . US OB Comp + 14 Wk  . POC Urinalysis Dipstick OB    Jacklyn Shell DNP, CNM 05/31/2018 3:39 PM

## 2018-05-31 NOTE — Patient Instructions (Signed)
 First Trimester of Pregnancy The first trimester of pregnancy is from week 1 until the end of week 12 (months 1 through 3). A week after a sperm fertilizes an egg, the egg will implant on the wall of the uterus. This embryo will begin to develop into a baby. Genes from you and your partner are forming the baby. The female genes determine whether the baby is a boy or a girl. At 6-8 weeks, the eyes and face are formed, and the heartbeat can be seen on ultrasound. At the end of 12 weeks, all the baby's organs are formed.  Now that you are pregnant, you will want to do everything you can to have a healthy baby. Two of the most important things are to get good prenatal care and to follow your health care provider's instructions. Prenatal care is all the medical care you receive before the baby's birth. This care will help prevent, find, and treat any problems during the pregnancy and childbirth. BODY CHANGES Your body goes through many changes during pregnancy. The changes vary from woman to woman.   You may gain or lose a couple of pounds at first.  You may feel sick to your stomach (nauseous) and throw up (vomit). If the vomiting is uncontrollable, call your health care provider.  You may tire easily.  You may develop headaches that can be relieved by medicines approved by your health care provider.  You may urinate more often. Painful urination may mean you have a bladder infection.  You may develop heartburn as a result of your pregnancy.  You may develop constipation because certain hormones are causing the muscles that push waste through your intestines to slow down.  You may develop hemorrhoids or swollen, bulging veins (varicose veins).  Your breasts may begin to grow larger and become tender. Your nipples may stick out more, and the tissue that surrounds them (areola) may become darker.  Your gums may bleed and may be sensitive to brushing and flossing.  Dark spots or blotches  (chloasma, mask of pregnancy) may develop on your face. This will likely fade after the baby is born.  Your menstrual periods will stop.  You may have a loss of appetite.  You may develop cravings for certain kinds of food.  You may have changes in your emotions from day to day, such as being excited to be pregnant or being concerned that something may go wrong with the pregnancy and baby.  You may have more vivid and strange dreams.  You may have changes in your hair. These can include thickening of your hair, rapid growth, and changes in texture. Some women also have hair loss during or after pregnancy, or hair that feels dry or thin. Your hair will most likely return to normal after your baby is born. WHAT TO EXPECT AT YOUR PRENATAL VISITS During a routine prenatal visit:  You will be weighed to make sure you and the baby are growing normally.  Your blood pressure will be taken.  Your abdomen will be measured to track your baby's growth.  The fetal heartbeat will be listened to starting around week 10 or 12 of your pregnancy.  Test results from any previous visits will be discussed. Your health care provider may ask you:  How you are feeling.  If you are feeling the baby move.  If you have had any abnormal symptoms, such as leaking fluid, bleeding, severe headaches, or abdominal cramping.  If you have any questions. Other   tests that may be performed during your first trimester include:  Blood tests to find your blood type and to check for the presence of any previous infections. They will also be used to check for low iron levels (anemia) and Rh antibodies. Later in the pregnancy, blood tests for diabetes will be done along with other tests if problems develop.  Urine tests to check for infections, diabetes, or protein in the urine.  An ultrasound to confirm the proper growth and development of the baby.  An amniocentesis to check for possible genetic problems.  Fetal  screens for spina bifida and Down syndrome.  You may need other tests to make sure you and the baby are doing well. HOME CARE INSTRUCTIONS  Medicines  Follow your health care provider's instructions regarding medicine use. Specific medicines may be either safe or unsafe to take during pregnancy.  Take your prenatal vitamins as directed.  If you develop constipation, try taking a stool softener if your health care provider approves. Diet  Eat regular, well-balanced meals. Choose a variety of foods, such as meat or vegetable-based protein, fish, milk and low-fat dairy products, vegetables, fruits, and whole grain breads and cereals. Your health care provider will help you determine the amount of weight gain that is right for you.  Avoid raw meat and uncooked cheese. These carry germs that can cause birth defects in the baby.  Eating four or five small meals rather than three large meals a day may help relieve nausea and vomiting. If you start to feel nauseous, eating a few soda crackers can be helpful. Drinking liquids between meals instead of during meals also seems to help nausea and vomiting.  If you develop constipation, eat more high-fiber foods, such as fresh vegetables or fruit and whole grains. Drink enough fluids to keep your urine clear or pale yellow. Activity and Exercise  Exercise only as directed by your health care provider. Exercising will help you:  Control your weight.  Stay in shape.  Be prepared for labor and delivery.  Experiencing pain or cramping in the lower abdomen or low back is a good sign that you should stop exercising. Check with your health care provider before continuing normal exercises.  Try to avoid standing for long periods of time. Move your legs often if you must stand in one place for a long time.  Avoid heavy lifting.  Wear low-heeled shoes, and practice good posture.  You may continue to have sex unless your health care provider directs you  otherwise. Relief of Pain or Discomfort  Wear a good support bra for breast tenderness.   Take warm sitz baths to soothe any pain or discomfort caused by hemorrhoids. Use hemorrhoid cream if your health care provider approves.   Rest with your legs elevated if you have leg cramps or low back pain.  If you develop varicose veins in your legs, wear support hose. Elevate your feet for 15 minutes, 3-4 times a day. Limit salt in your diet. Prenatal Care  Schedule your prenatal visits by the twelfth week of pregnancy. They are usually scheduled monthly at first, then more often in the last 2 months before delivery.  Write down your questions. Take them to your prenatal visits.  Keep all your prenatal visits as directed by your health care provider. Safety  Wear your seat belt at all times when driving.  Make a list of emergency phone numbers, including numbers for family, friends, the hospital, and police and fire departments. General   Tips  Ask your health care provider for a referral to a local prenatal education class. Begin classes no later than at the beginning of month 6 of your pregnancy.  Ask for help if you have counseling or nutritional needs during pregnancy. Your health care provider can offer advice or refer you to specialists for help with various needs.  Do not use hot tubs, steam rooms, or saunas.  Do not douche or use tampons or scented sanitary pads.  Do not cross your legs for long periods of time.  Avoid cat litter boxes and soil used by cats. These carry germs that can cause birth defects in the baby and possibly loss of the fetus by miscarriage or stillbirth.  Avoid all smoking, herbs, alcohol, and medicines not prescribed by your health care provider. Chemicals in these affect the formation and growth of the baby.  Schedule a dentist appointment. At home, brush your teeth with a soft toothbrush and be gentle when you floss. SEEK MEDICAL CARE IF:   You have  dizziness.  You have mild pelvic cramps, pelvic pressure, or nagging pain in the abdominal area.  You have persistent nausea, vomiting, or diarrhea.  You have a bad smelling vaginal discharge.  You have pain with urination.  You notice increased swelling in your face, hands, legs, or ankles. SEEK IMMEDIATE MEDICAL CARE IF:   You have a fever.  You are leaking fluid from your vagina.  You have spotting or bleeding from your vagina.  You have severe abdominal cramping or pain.  You have rapid weight gain or loss.  You vomit blood or material that looks like coffee grounds.  You are exposed to German measles and have never had them.  You are exposed to fifth disease or chickenpox.  You develop a severe headache.  You have shortness of breath.  You have any kind of trauma, such as from a fall or a car accident. Document Released: 01/07/2001 Document Revised: 05/30/2013 Document Reviewed: 11/23/2012 [employer] Patient Information 2015 [employer]. This information is not intended to replace advice given to you by your health care provider. Make sure you discuss any questions you have with your health care provider.   Nausea & Vomiting  Have saltine crackers or pretzels by your bed and eat a few bites before you raise your head out of bed in the morning  Eat small frequent meals throughout the day instead of large meals  Drink plenty of fluids throughout the day to stay hydrated, just don't drink a lot of fluids with your meals.  This can make your stomach fill up faster making you feel sick  Do not brush your teeth right after you eat  Products with real ginger are good for nausea, like ginger ale and ginger hard candy Make sure it says made with real ginger!  Sucking on sour candy like lemon heads is also good for nausea  If your prenatal vitamins make you nauseated, take them at night so you will sleep through the nausea  Sea Bands  If you feel like you need  medicine for the nausea & vomiting please let us know  If you are unable to keep any fluids or food down please let us know   Constipation  Drink plenty of fluid, preferably water, throughout the day  Eat foods high in fiber such as fruits, vegetables, and grains  Exercise, such as walking, is a good way to keep your bowels regular  Drink warm fluids, especially warm   prune juice, or decaf coffee  Eat a 1/2 cup of real oatmeal (not instant), 1/2 cup applesauce, and 1/2-1 cup warm prune juice every day  If needed, you may take Colace (docusate sodium) stool softener once or twice a day to help keep the stool soft. If you are pregnant, wait until you are out of your first trimester (12-14 weeks of pregnancy)  If you still are having problems with constipation, you may take Miralax once daily as needed to help keep your bowels regular.  If you are pregnant, wait until you are out of your first trimester (12-14 weeks of pregnancy)  Safe Medications in Pregnancy   Acne: Benzoyl Peroxide Salicylic Acid  Backache/Headache: Tylenol: 2 regular strength every 4 hours OR              2 Extra strength every 6 hours  Colds/Coughs/Allergies: Benadryl (alcohol free) 25 mg every 6 hours as needed Breath right strips Claritin Cepacol throat lozenges Chloraseptic throat spray Cold-Eeze- up to three times per day Cough drops, alcohol free Flonase (by prescription only) Guaifenesin Mucinex Robitussin DM (plain only, alcohol free) Saline nasal spray/drops Sudafed (pseudoephedrine) & Actifed ** use only after [redacted] weeks gestation and if you do not have high blood pressure Tylenol Vicks Vaporub Zinc lozenges Zyrtec   Constipation: Colace Ducolax suppositories Fleet enema Glycerin suppositories Metamucil Milk of magnesia Miralax Senokot Smooth move tea  Diarrhea: Kaopectate Imodium A-D  *NO pepto Bismol  Hemorrhoids: Anusol Anusol HC Preparation  H Tucks  Indigestion: Tums Maalox Mylanta Zantac  Pepcid  Insomnia: Benadryl (alcohol free) 25mg every 6 hours as needed Tylenol PM Unisom, no Gelcaps  Leg Cramps: Tums MagGel  Nausea/Vomiting:  Bonine Dramamine Emetrol Ginger extract Sea bands Meclizine  Nausea medication to take during pregnancy:  Unisom (doxylamine succinate 25 mg tablets) Take one tablet daily at bedtime. If symptoms are not adequately controlled, the dose can be increased to a maximum recommended dose of two tablets daily (1/2 tablet in the morning, 1/2 tablet mid-afternoon and one at bedtime). Vitamin B6 100mg tablets. Take one tablet twice a day (up to 200 mg per day).  Skin Rashes: Aveeno products Benadryl cream or 25mg every 6 hours as needed Calamine Lotion 1% cortisone cream  Yeast infection: Gyne-lotrimin 7 Monistat 7   **If taking multiple medications, please check labels to avoid duplicating the same active ingredients **take medication as directed on the label ** Do not exceed 4000 mg of tylenol in 24 hours **Do not take medications that contain aspirin or ibuprofen      

## 2018-06-28 ENCOUNTER — Encounter: Payer: BLUE CROSS/BLUE SHIELD | Admitting: Women's Health

## 2018-06-28 ENCOUNTER — Encounter: Payer: BLUE CROSS/BLUE SHIELD | Admitting: Obstetrics & Gynecology

## 2018-06-28 ENCOUNTER — Other Ambulatory Visit: Payer: BLUE CROSS/BLUE SHIELD

## 2018-07-05 ENCOUNTER — Encounter: Payer: Self-pay | Admitting: *Deleted

## 2018-07-06 ENCOUNTER — Ambulatory Visit (INDEPENDENT_AMBULATORY_CARE_PROVIDER_SITE_OTHER): Payer: BLUE CROSS/BLUE SHIELD | Admitting: Women's Health

## 2018-07-06 ENCOUNTER — Other Ambulatory Visit: Payer: Self-pay

## 2018-07-06 ENCOUNTER — Encounter: Payer: Self-pay | Admitting: Women's Health

## 2018-07-06 ENCOUNTER — Ambulatory Visit (INDEPENDENT_AMBULATORY_CARE_PROVIDER_SITE_OTHER): Payer: BLUE CROSS/BLUE SHIELD

## 2018-07-06 VITALS — BP 110/66 | HR 89 | Wt 145.0 lb

## 2018-07-06 DIAGNOSIS — Z3402 Encounter for supervision of normal first pregnancy, second trimester: Secondary | ICD-10-CM

## 2018-07-06 DIAGNOSIS — Z363 Encounter for antenatal screening for malformations: Secondary | ICD-10-CM | POA: Diagnosis not present

## 2018-07-06 DIAGNOSIS — Z3482 Encounter for supervision of other normal pregnancy, second trimester: Secondary | ICD-10-CM

## 2018-07-06 DIAGNOSIS — Z1389 Encounter for screening for other disorder: Secondary | ICD-10-CM

## 2018-07-06 DIAGNOSIS — Z3A19 19 weeks gestation of pregnancy: Secondary | ICD-10-CM

## 2018-07-06 DIAGNOSIS — Z1379 Encounter for other screening for genetic and chromosomal anomalies: Secondary | ICD-10-CM

## 2018-07-06 DIAGNOSIS — Z331 Pregnant state, incidental: Secondary | ICD-10-CM

## 2018-07-06 LAB — POCT URINALYSIS DIPSTICK OB
Blood, UA: NEGATIVE
Glucose, UA: NEGATIVE
Ketones, UA: NEGATIVE
Leukocytes, UA: NEGATIVE
Nitrite, UA: NEGATIVE
POC,PROTEIN,UA: NEGATIVE

## 2018-07-06 NOTE — Progress Notes (Signed)
LOW-RISK PREGNANCY VISIT Patient name: Jacqueline Johnson MRN [id number]  Date of birth: 11/28/1995 Chief Complaint:   Routine Prenatal Visit ([place] today; 2nd IT; rash on inner thighs )  History of Present Illness:   Jacqueline Johnson is a 23 y.o. G13P1001 female at [redacted]w[redacted]d with an Estimated Date of Delivery: 11/26/18 being seen today for ongoing management of a low-risk pregnancy.  Today she reports rash inner Lt thigh, itchy, thinks it's PUPPS. Contractions: Not present. Vag. Bleeding: None.  Movement: Present. denies leaking of fluid. Review of Systems:   Pertinent items are noted in HPI Denies abnormal vaginal discharge w/ itching/odor/irritation, headaches, visual changes, shortness of breath, chest pain, abdominal pain, severe nausea/vomiting, or problems with urination or bowel movements unless otherwise stated above. Pertinent History Reviewed:  Reviewed past medical,surgical, social, obstetrical and family history.  Reviewed problem list, medications and allergies. Physical Assessment:   Vitals:   07/06/18 1240  BP: 110/66  Pulse: 89  Weight: 145 lb (65.8 kg)  Body mass index is 24.13 kg/m.        Physical Examination:   General appearance: Well appearing, and in no distress  Mental status: Alert, oriented to person, place, and time  Skin: Warm & dry  Cardiovascular: Normal heart rate noted  Respiratory: Normal respiratory effort, no distress  Abdomen: Soft, gravid, nontender  Pelvic: Cervical exam deferred         Extremities: Edema: None, ~5cm round erythematous area Lt inner thigh with small scab c/w insect bite, not hot, no evidence of infection  Fetal Status: Fetal Heart Rate (bpm): 164 U/S   Movement: Present     TODAY'S [place] 53+6 wks,cephalic,posterior placenta gr 0,normal ovaries bilat,cx 3.8 cm,fhr 164 bpm,efw 328 g 70%,anatomy complete no obvious abnormalities   Results for orders placed or performed in visit on 07/06/18 (from the past 24 hour(s))  POC Urinalysis Dipstick OB   Collection Time: 07/06/18 12:35 PM  Result Value Ref Range   Color, UA     Clarity, UA     Glucose, UA Negative Negative   Bilirubin, UA     Ketones, UA neg    Spec Grav, UA     Blood, UA neg    pH, UA     POC,PROTEIN,UA Negative Negative, Trace, Small (1+), Moderate (2+), Large (3+), 4+   Urobilinogen, UA     Nitrite, UA neg    Leukocytes, UA Negative Negative   Appearance     Odor      Assessment & Plan:  1) Low-risk pregnancy G2P1001 at [redacted]w[redacted]d with an Estimated Date of Delivery: 11/26/18   2) Insect bite, Lt inner thigh, continue to monitor, if enlarges/becomes hot/signs of infection let us know, can use hydrocortisone cream and cool compresses for itching   Meds: No orders of the defined types were placed in this encounter.  Labs/procedures today: anatomy u/s, 2nd IT  Plan:  Continue routine obstetrical care   Reviewed: Preterm labor symptoms and general obstetric precautions including but not limited to vaginal bleeding, contractions, leaking of fluid and fetal movement were reviewed in detail with the patient.  All questions were answered. Has home bp cuff. Check bp weekly, let us know if >140/90.   Follow-up: Return in about 4 weeks (around 08/03/2018) for LROB webex; then 4wks later for LROB in peson w/ pap and pn2.  Orders Placed This Encounter  Procedures  . INTEGRATED 2  . POC Urinalysis Dipstick OB   Roma Schanz CNM, [facility] 07/06/2018 1:11 PM

## 2018-07-06 NOTE — Patient Instructions (Signed)
Jacqueline Johnson, I greatly value your feedback.  If you receive a survey following your visit with us today, we appreciate you taking the time to fill it out.  Thanks, Jacqueline Johnson, CNM, [facility]NP[facility]  [facility] now [facility] ([address] [place], [place] [id number]) Entrance located off of [address] Free 24/7 valet parking   Home Blood Pressure Monitoring for Patients   Your provider has recommended that you check your blood pressure (BP) at least once a week at home. If you do not have a blood pressure cuff at home, one will be provided for you. Contact your provider if you have not received your monitor within 1 week.   Helpful Tips for Accurate Home Blood Pressure Checks  . Don't smoke, exercise, or drink caffeine 30 minutes before checking your BP . Use the restroom before checking your BP (a full bladder can raise your pressure) . Relax in a comfortable upright chair . Feet on the ground . Left arm resting comfortably on a flat surface at the level of your heart . Legs uncrossed . Back supported . Sit quietly and don't talk . Place the cuff on your bare arm . Adjust snuggly, so that only two fingertips can fit between your skin and the top of the cuff . Check 2 readings separated by at least one minute . Keep a log of your BP readings . For a visual, please reference this diagram: http://ccnc.care/bpdiagram  Provider Name: [facility]     Phone: [phone]  Zone 1: ALL CLEAR  Continue to monitor your symptoms:  . BP reading is less than 140 (top number) or less than 90 (bottom number)  . No right upper stomach pain . No headaches or seeing spots . No feeling nauseated or throwing up . No swelling in face and hands  Zone 2: CAUTION Call your doctor's office for any of the following:  . BP reading is greater than 140 (top number) or greater than 90 (bottom number)  . Stomach pain under your ribs in the middle or  right side . Headaches or seeing spots . Feeling nauseated or throwing up . Swelling in face and hands  Zone 3: EMERGENCY  Seek immediate medical care if you have any of the following:  . BP reading is greater than160 (top number) or greater than 110 (bottom number) . Severe headaches not improving with Tylenol . Serious difficulty catching your breath . Any worsening symptoms from Zone 2      Second Trimester of Pregnancy The second trimester is from week 14 through week 27 (months 4 through 6). The second trimester is often a time when you feel your best. Your body has adjusted to being pregnant, and you begin to feel better physically. Usually, morning sickness has lessened or quit completely, you may have more energy, and you may have an increase in appetite. The second trimester is also a time when the fetus is growing rapidly. At the end of the sixth month, the fetus is about 9 inches long and weighs about 1 pounds. You will likely begin to feel the baby move (quickening) between 16 and 20 weeks of pregnancy. Body changes during your second trimester Your body continues to go through many changes during your second trimester. The changes vary from woman to woman.  Your weight will continue to increase. You will notice your lower abdomen bulging out.  You may begin to get  stretch marks on your hips, abdomen, and breasts.  You may develop headaches that can be relieved by medicines. The medicines should be approved by your health care provider.  You may urinate more often because the fetus is pressing on your bladder.  You may develop or continue to have heartburn as a result of your pregnancy.  You may develop constipation because certain hormones are causing the muscles that push waste through your intestines to slow down.  You may develop hemorrhoids or swollen, bulging veins (varicose veins).  You may have back pain. This is caused by: ? Weight gain. ? Pregnancy hormones  that are relaxing the joints in your pelvis. ? A shift in weight and the muscles that support your balance.  Your breasts will continue to grow and they will continue to become tender.  Your gums may bleed and may be sensitive to brushing and flossing.  Dark spots or blotches (chloasma, mask of pregnancy) may develop on your face. This will likely fade after the baby is born.  A dark line from your belly button to the pubic area (linea nigra) may appear. This will likely fade after the baby is born.  You may have changes in your hair. These can include thickening of your hair, rapid growth, and changes in texture. Some women also have hair loss during or after pregnancy, or hair that feels dry or thin. Your hair will most likely return to normal after your baby is born.  What to expect at prenatal visits During a routine prenatal visit:  You will be weighed to make sure you and the fetus are growing normally.  Your blood pressure will be taken.  Your abdomen will be measured to track your baby's growth.  The fetal heartbeat will be listened to.  Any test results from the previous visit will be discussed.  Your health care provider may ask you:  How you are feeling.  If you are feeling the baby move.  If you have had any abnormal symptoms, such as leaking fluid, bleeding, severe headaches, or abdominal cramping.  If you are using any tobacco products, including cigarettes, chewing tobacco, and electronic cigarettes.  If you have any questions.  Other tests that may be performed during your second trimester include:  Blood tests that check for: ? Low iron levels (anemia). ? High blood sugar that affects pregnant women (gestational diabetes) between 85 and 28 weeks. ? Rh antibodies. This is to check for a protein on red blood cells (Rh factor).  Urine tests to check for infections, diabetes, or protein in the urine.  An ultrasound to confirm the proper growth and  development of the baby.  An amniocentesis to check for possible genetic problems.  Fetal screens for spina bifida and Down syndrome.  HIV (human immunodeficiency virus) testing. Routine prenatal testing includes screening for HIV, unless you choose not to have this test.  Follow these instructions at home: Medicines  Follow your health care provider's instructions regarding medicine use. Specific medicines may be either safe or unsafe to take during pregnancy.  Take a prenatal vitamin that contains at least 600 micrograms (mcg) of folic acid.  If you develop constipation, try taking a stool softener if your health care provider approves. Eating and drinking  Eat a balanced diet that includes fresh fruits and vegetables, whole grains, good sources of protein such as meat, eggs, or tofu, and low-fat dairy. Your health care provider will help you determine the amount of weight gain that  is right for you.  Avoid raw meat and uncooked cheese. These carry germs that can cause birth defects in the baby.  If you have low calcium intake from food, talk to your health care provider about whether you should take a daily calcium supplement.  Limit foods that are high in fat and processed sugars, such as fried and sweet foods.  To prevent constipation: ? Drink enough fluid to keep your urine clear or pale yellow. ? Eat foods that are high in fiber, such as fresh fruits and vegetables, whole grains, and beans. Activity  Exercise only as directed by your health care provider. Most women can continue their usual exercise routine during pregnancy. Try to exercise for 30 minutes at least 5 days a week. Stop exercising if you experience uterine contractions.  Avoid heavy lifting, wear low heel shoes, and practice good posture.  A sexual relationship may be continued unless your health care provider directs you otherwise. Relieving pain and discomfort  Wear a good support bra to prevent discomfort  from breast tenderness.  Take warm sitz baths to soothe any pain or discomfort caused by hemorrhoids. Use hemorrhoid cream if your health care provider approves.  Rest with your legs elevated if you have leg cramps or low back pain.  If you develop varicose veins, wear support hose. Elevate your feet for 15 minutes, 3-4 times a day. Limit salt in your diet. Prenatal Care  Write down your questions. Take them to your prenatal visits.  Keep all your prenatal visits as told by your health care provider. This is important. Safety  Wear your seat belt at all times when driving.  Make a list of emergency phone numbers, including numbers for family, friends, the hospital, and police and fire departments. General instructions  Ask your health care provider for a referral to a local prenatal education class. Begin classes no later than the beginning of month 6 of your pregnancy.  Ask for help if you have counseling or nutritional needs during pregnancy. Your health care provider can offer advice or refer you to specialists for help with various needs.  Do not use hot tubs, steam rooms, or saunas.  Do not douche or use tampons or scented sanitary pads.  Do not cross your legs for long periods of time.  Avoid cat litter boxes and soil used by cats. These carry germs that can cause birth defects in the baby and possibly loss of the fetus by miscarriage or stillbirth.  Avoid all smoking, herbs, alcohol, and unprescribed drugs. Chemicals in these products can affect the formation and growth of the baby.  Do not use any products that contain nicotine or tobacco, such as cigarettes and e-cigarettes. If you need help quitting, ask your health care provider.  Visit your dentist if you have not gone yet during your pregnancy. Use a soft toothbrush to brush your teeth and be gentle when you floss. Contact a health care provider if:  You have dizziness.  You have mild pelvic cramps, pelvic  pressure, or nagging pain in the abdominal area.  You have persistent nausea, vomiting, or diarrhea.  You have a bad smelling vaginal discharge.  You have pain when you urinate. Get help right away if:  You have a fever.  You are leaking fluid from your vagina.  You have spotting or bleeding from your vagina.  You have severe abdominal cramping or pain.  You have rapid weight gain or weight loss.  You have shortness of breath  with chest pain.  You notice sudden or extreme swelling of your face, hands, ankles, feet, or legs.  You have not felt your baby move in over an hour.  You have severe headaches that do not go away when you take medicine.  You have vision changes. Summary  The second trimester is from week 14 through week 27 (months 4 through 6). It is also a time when the fetus is growing rapidly.  Your body goes through many changes during pregnancy. The changes vary from woman to woman.  Avoid all smoking, herbs, alcohol, and unprescribed drugs. These chemicals affect the formation and growth your baby.  Do not use any tobacco products, such as cigarettes, chewing tobacco, and e-cigarettes. If you need help quitting, ask your health care provider.  Contact your health care provider if you have any questions. Keep all prenatal visits as told by your health care provider. This is important. This information is not intended to replace advice given to you by your health care provider. Make sure you discuss any questions you have with your health care provider. Document Released: 01/07/2001 Document Revised: 06/21/2015 Document Reviewed: 03/16/2012 Elsevier Interactive Patient Education  2017 [employer].

## 2018-07-06 NOTE — Progress Notes (Signed)
[place] 33+4 wks,cephalic,posterior placenta gr 0,normal ovaries bilat,cx 3.8 cm,fhr 164 bpm,efw 328 g 70%,anatomy complete no obvious abnormalities

## 2018-07-09 LAB — INTEGRATED 2
AFP MoM: 1.44
Alpha-Fetoprotein: 71.8 ng/mL
Crown Rump Length: 73 mm
DIA MoM: 0.47
DIA Value: 86.8 pg/mL
Estriol, Unconjugated: 1.99 ng/mL
Gest. Age on Collection Date: 13.3 weeks
Gestational Age: 19.4 weeks
Maternal Age at EDD: 24.3 yr
Nuchal Translucency (NT): 2.4 mm
Nuchal Translucency MoM: 1.43
Number of Fetuses: 1
PAPP-A MoM: 1.64
PAPP-A Value: 2501.8 ng/mL
Test Results:: NEGATIVE
Weight: 137 [lb_av]
Weight: 145 [lb_av]
hCG MoM: 0.33
hCG Value: 7.6 IU/mL
uE3 MoM: 1.06

## 2018-08-03 ENCOUNTER — Other Ambulatory Visit: Payer: Self-pay

## 2018-08-03 ENCOUNTER — Ambulatory Visit (INDEPENDENT_AMBULATORY_CARE_PROVIDER_SITE_OTHER): Payer: BC Managed Care – PPO | Admitting: Obstetrics & Gynecology

## 2018-08-03 ENCOUNTER — Encounter: Payer: Self-pay | Admitting: Obstetrics & Gynecology

## 2018-08-03 VITALS — BP 99/69 | Wt 149.5 lb

## 2018-08-03 DIAGNOSIS — Z3482 Encounter for supervision of other normal pregnancy, second trimester: Secondary | ICD-10-CM

## 2018-08-03 DIAGNOSIS — Z3A23 23 weeks gestation of pregnancy: Secondary | ICD-10-CM

## 2018-08-03 NOTE — Progress Notes (Signed)
   Webex VIRTUAL OBSTETRICS VISIT ENCOUNTER NOTE  I connected with Mike Gip on 08/03/18 at  1:45 PM EDT by webex at home and verified that I am speaking with the correct person using two identifiers.   I discussed the limitations, risks, security and privacy concerns of performing an evaluation and management service by telephone and the availability of in person appointments. I also discussed with the patient that there may be a patient responsible charge related to this service. The patient expressed understanding and agreed to proceed.  Subjective:  Jacqueline Johnson is a 23 y.o. G2P1001 at [redacted]w[redacted]d being followed for ongoing prenatal care.  She is currently monitored for the following issues for this low-risk pregnancy and has Supervision of normal pregnancy on their problem list.  Patient reports no complaints. Reports fetal movement. Denies any contractions, bleeding or leaking of fluid.   The following portions of the patient's history were reviewed and updated as appropriate: allergies, current medications, past family history, past medical history, past social history, past surgical history and problem list.   Objective:   General:  Alert, oriented and cooperative.   Mental Status: Normal mood and affect perceived. Normal judgment and thought content.  Rest of physical exam deferred due to type of encounter  Assessment and Plan:  Pregnancy: G2P1001 at [redacted]w[redacted]d 1. Encounter for supervision of other normal pregnancy in second trimester   Preterm labor symptoms and general obstetric precautions including but not limited to vaginal bleeding, contractions, leaking of fluid and fetal movement were reviewed in detail with the patient.  I discussed the assessment and treatment plan with the patient. The patient was provided an opportunity to ask questions and all were answered. The patient agreed with the plan and demonstrated an understanding of the instructions. The patient was advised to call back  or seek an in-person office evaluation/go to [facility] at [facility] for any urgent or concerning symptoms. Please refer to After Visit Summary for other counseling recommendations.   I provided 11 minutes of non-face-to-face time during this encounter.  Return in about 4 weeks (around 08/31/2018) for PN2, LROB.  Future Appointments  Date Time Provider [place]  08/31/2018  9:00 AM [facility]  08/31/2018  9:30 AM Roma Schanz, CNM [facility]    Florian Buff, MD [facility] [place]

## 2018-08-31 ENCOUNTER — Other Ambulatory Visit: Payer: BLUE CROSS/BLUE SHIELD

## 2018-08-31 ENCOUNTER — Encounter: Payer: BLUE CROSS/BLUE SHIELD | Admitting: Women's Health

## 2018-09-13 ENCOUNTER — Other Ambulatory Visit (HOSPITAL_COMMUNITY)
Admission: RE | Admit: 2018-09-13 | Discharge: 2018-09-13 | Disposition: A | Discharge status: Home / Self Care | Payer: BC Managed Care – PPO | Source: Ambulatory Visit | Attending: Obstetrics & Gynecology | Admitting: Obstetrics & Gynecology

## 2018-09-13 ENCOUNTER — Other Ambulatory Visit: Payer: Self-pay

## 2018-09-13 ENCOUNTER — Encounter: Payer: Self-pay | Admitting: Women's Health

## 2018-09-13 ENCOUNTER — Other Ambulatory Visit: Payer: BC Managed Care – PPO

## 2018-09-13 ENCOUNTER — Ambulatory Visit (INDEPENDENT_AMBULATORY_CARE_PROVIDER_SITE_OTHER): Payer: BC Managed Care – PPO | Admitting: Women's Health

## 2018-09-13 VITALS — BP 120/76 | HR 85 | Wt 159.2 lb

## 2018-09-13 DIAGNOSIS — Z3A29 29 weeks gestation of pregnancy: Secondary | ICD-10-CM

## 2018-09-13 DIAGNOSIS — Z3483 Encounter for supervision of other normal pregnancy, third trimester: Secondary | ICD-10-CM

## 2018-09-13 DIAGNOSIS — Z124 Encounter for screening for malignant neoplasm of cervix: Secondary | ICD-10-CM | POA: Diagnosis present

## 2018-09-13 DIAGNOSIS — Z331 Pregnant state, incidental: Secondary | ICD-10-CM

## 2018-09-13 DIAGNOSIS — Z23 Encounter for immunization: Secondary | ICD-10-CM

## 2018-09-13 DIAGNOSIS — Z1389 Encounter for screening for other disorder: Secondary | ICD-10-CM

## 2018-09-13 LAB — POCT URINALYSIS DIPSTICK OB
Blood, UA: NEGATIVE
Glucose, UA: NEGATIVE
Ketones, UA: NEGATIVE
Leukocytes, UA: NEGATIVE
Nitrite, UA: NEGATIVE
POC,PROTEIN,UA: NEGATIVE

## 2018-09-13 NOTE — Patient Instructions (Signed)
Jacqueline Johnson, I greatly value your feedback.  If you receive a survey following your visit with [place] today, we appreciate you taking the time to fill it out.  Thanks, Jacqueline Johnson, CNM, [facility]  [place]!!! It is now [place] at [facility] ([place], [place] [address]) Entrance located off of [place] [address] parking    Go to [email or web address] to register for FREE online childbirth classes   Call the office [phone]) or go to [facility] if:  You begin to have strong, frequent contractions  Your water breaks.  Sometimes it is a big gush of fluid, sometimes it is just a trickle that keeps getting your panties wet or running down your legs  You have vaginal bleeding.  It is normal to have a small amount of spotting if your cervix was checked.   You don't feel your baby moving like normal.  If you don't, get you something to eat and drink and lay down and focus on feeling your baby move.  You should feel at least 10 movements in 2 hours.  If you don't, you should call the office or go to [facility].    Tdap Vaccine  It is recommended that you get the Tdap vaccine during the third trimester of EACH pregnancy to help protect your baby from getting pertussis (whooping cough)  27-36 weeks is the BEST time to do this so that you can pass the protection on to your baby. During pregnancy is better than after pregnancy, but if you are unable to get it during pregnancy it will be offered at the hospital.   You can get this vaccine with [place], at the health department, your family doctor, or some local pharmacies  Everyone who will be around your baby should also be up-to-date on their vaccines before the baby comes. Adults (who are not pregnant) only need 1 dose of Tdap during adulthood.   [place] Pediatricians/Family Doctors:  [facility] [phone]                  [place] [phone] (usually not accepting new patients unless you have family there already, you are always welcome to call and ask)       [facility] Department [phone]       [facility] Pediatricians/Family Doctors:   [facility]: [phone]  [facility]: [phone]  [facility]: [place] Doctors:   [facility]: [facility]: [place]:  [place]: [phone]   Home Blood Pressure Monitoring for Patients   Your provider has recommended that you check your blood pressure (BP) at least once a week at home. If you do not have a blood pressure cuff at home, one will be provided for you. Contact your provider if you have not received your monitor within 1 week.   Helpful Tips for Accurate Home Blood Pressure Checks  . Don't smoke, exercise, or drink caffeine 30 minutes before checking your BP . Use the restroom before checking your BP (a full bladder can raise your pressure) . Relax in a comfortable upright chair . Feet on the ground . Left arm resting comfortably on a flat surface at the level of your heart . Legs uncrossed . Back supported . Sit quietly and don't talk .  Place the cuff on your bare arm . Adjust snuggly, so that only two fingertips can fit between your skin and the top of the cuff . Check 2 readings separated by at least one minute . Keep a log of your BP readings . For a visual, please reference this diagram: http://ccnc.care/bpdiagram  Provider Name: [facility]     Phone: [phone]  Zone 1: ALL CLEAR  Continue to monitor your symptoms:  . BP reading is less than 140 (top number) or less than 90 (bottom number)  . No right upper stomach pain . No headaches or seeing spots . No feeling nauseated or throwing up . No swelling in face and  hands  Zone 2: CAUTION Call your doctor's office for any of the following:  . BP reading is greater than 140 (top number) or greater than 90 (bottom number)  . Stomach pain under your ribs in the middle or right side . Headaches or seeing spots . Feeling nauseated or throwing up . Swelling in face and hands  Zone 3: EMERGENCY  Seek immediate medical care if you have any of the following:  . BP reading is greater than160 (top number) or greater than 110 (bottom number) . Severe headaches not improving with Tylenol . Serious difficulty catching your breath . Any worsening symptoms from Zone 2   Third Trimester of Pregnancy The third trimester is from week 29 through week 42, months 7 through 9. The third trimester is a time when the fetus is growing rapidly. At the end of the ninth month, the fetus is about 20 inches in length and weighs 6-10 pounds.  BODY CHANGES Your body goes through many changes during pregnancy. The changes vary from woman to woman.   Your weight will continue to increase. You can expect to gain 25-35 pounds (11-16 kg) by the end of the pregnancy.  You may begin to get stretch marks on your hips, abdomen, and breasts.  You may urinate more often because the fetus is moving lower into your pelvis and pressing on your bladder.  You may develop or continue to have heartburn as a result of your pregnancy.  You may develop constipation because certain hormones are causing the muscles that push waste through your intestines to slow down.  You may develop hemorrhoids or swollen, bulging veins (varicose veins).  You may have pelvic pain because of the weight gain and pregnancy hormones relaxing your joints between the bones in your pelvis. Backaches may result from overexertion of the muscles supporting your posture.  You may have changes in your hair. These can include thickening of your hair, rapid growth, and changes in texture. Some women also have hair loss during  or after pregnancy, or hair that feels dry or thin. Your hair will most likely return to normal after your baby is born.  Your breasts will continue to grow and be tender. A yellow discharge may leak from your breasts called colostrum.  Your belly button may stick out.  You may feel short of breath because of your expanding uterus.  You may notice the fetus "dropping," or moving lower in your abdomen.  You may have a bloody mucus discharge. This usually occurs a few days to a week before labor begins.  Your cervix becomes thin and soft (effaced) near your due date. WHAT TO EXPECT AT YOUR PRENATAL EXAMS  You will have prenatal exams every 2 weeks until week 36. Then, you will have weekly prenatal exams. During  a routine prenatal visit:  You will be weighed to make sure you and the fetus are growing normally.  Your blood pressure is taken.  Your abdomen will be measured to track your baby's growth.  The fetal heartbeat will be listened to.  Any test results from the previous visit will be discussed.  You may have a cervical check near your due date to see if you have effaced. At around 36 weeks, your caregiver will check your cervix. At the same time, your caregiver will also perform a test on the secretions of the vaginal tissue. This test is to determine if a type of bacteria, Group B streptococcus, is present. Your caregiver will explain this further. Your caregiver may ask you:  What your birth plan is.  How you are feeling.  If you are feeling the baby move.  If you have had any abnormal symptoms, such as leaking fluid, bleeding, severe headaches, or abdominal cramping.  If you have any questions. Other tests or screenings that may be performed during your third trimester include:  Blood tests that check for low iron levels (anemia).  Fetal testing to check the health, activity level, and growth of the fetus. Testing is done if you have certain medical conditions or if  there are problems during the pregnancy. FALSE LABOR You may feel small, irregular contractions that eventually go away. These are called Braxton Hicks contractions, or false labor. Contractions may last for hours, days, or even weeks before true labor sets in. If contractions come at regular intervals, intensify, or become painful, it is best to be seen by your caregiver.  SIGNS OF LABOR   Menstrual-like cramps.  Contractions that are 5 minutes apart or less.  Contractions that start on the top of the uterus and spread down to the lower abdomen and back.  A sense of increased pelvic pressure or back pain.  A watery or bloody mucus discharge that comes from the vagina. If you have any of these signs before the 37th week of pregnancy, call your caregiver right away. You need to go to the hospital to get checked immediately. HOME CARE INSTRUCTIONS   Avoid all smoking, herbs, alcohol, and unprescribed drugs. These chemicals affect the formation and growth of the baby.  Follow your caregiver's instructions regarding medicine use. There are medicines that are either safe or unsafe to take during pregnancy.  Exercise only as directed by your caregiver. Experiencing uterine cramps is a good sign to stop exercising.  Continue to eat regular, healthy meals.  Wear a good support bra for breast tenderness.  Do not use hot tubs, steam rooms, or saunas.  Wear your seat belt at all times when driving.  Avoid raw meat, uncooked cheese, cat litter boxes, and soil used by cats. These carry germs that can cause birth defects in the baby.  Take your prenatal vitamins.  Try taking a stool softener (if your caregiver approves) if you develop constipation. Eat more high-fiber foods, such as fresh vegetables or fruit and whole grains. Drink plenty of fluids to keep your urine clear or pale yellow.  Take warm sitz baths to soothe any pain or discomfort caused by hemorrhoids. Use hemorrhoid cream if your  caregiver approves.  If you develop varicose veins, wear support hose. Elevate your feet for 15 minutes, 3-4 times a day. Limit salt in your diet.  Avoid heavy lifting, wear low heal shoes, and practice good posture.  Rest a lot with your legs elevated if you  have leg cramps or low back pain.  Visit your dentist if you have not gone during your pregnancy. Use a soft toothbrush to brush your teeth and be gentle when you floss.  A sexual relationship may be continued unless your caregiver directs you otherwise.  Do not travel far distances unless it is absolutely necessary and only with the approval of your caregiver.  Take prenatal classes to understand, practice, and ask questions about the labor and delivery.  Make a trial run to the hospital.  Pack your hospital bag.  Prepare the baby's nursery.  Continue to go to all your prenatal visits as directed by your caregiver. SEEK MEDICAL CARE IF:  You are unsure if you are in labor or if your water has broken.  You have dizziness.  You have mild pelvic cramps, pelvic pressure, or nagging pain in your abdominal area.  You have persistent nausea, vomiting, or diarrhea.  You have a bad smelling vaginal discharge.  You have pain with urination. SEEK IMMEDIATE MEDICAL CARE IF:   You have a fever.  You are leaking fluid from your vagina.  You have spotting or bleeding from your vagina.  You have severe abdominal cramping or pain.  You have rapid weight loss or gain.  You have shortness of breath with chest pain.  You notice sudden or extreme swelling of your face, hands, ankles, feet, or legs.  You have not felt your baby move in over an hour.  You have severe headaches that do not go away with medicine.  You have vision changes. Document Released: 01/07/2001 Document Revised: 01/18/2013 Document Reviewed: 03/16/2012 [facility] Patient Information 2015 [place], [place]. This information is not intended to replace advice  given to you by your health care provider. Make sure you discuss any questions you have with your health care provider.

## 2018-09-13 NOTE — Progress Notes (Signed)
   LOW-RISK PREGNANCY VISIT Patient name: Jacqueline Johnson MRN [id number]  Date of birth: 1995-05-31 Chief Complaint:   Routine Prenatal Visit (pn2)  History of Present Illness:   Jacqueline Johnson is a 23 y.o. G4P1001 female at [redacted]w[redacted]d with an Estimated Date of Delivery: 11/26/18 being seen today for ongoing management of a low-risk pregnancy.  Today she reports no complaints. Contractions: Not present. Vag. Bleeding: None.  Movement: Present. denies leaking of fluid. Review of Systems:   Pertinent items are noted in HPI Denies abnormal vaginal discharge w/ itching/odor/irritation, headaches, visual changes, shortness of breath, chest pain, abdominal pain, severe nausea/vomiting, or problems with urination or bowel movements unless otherwise stated above. Pertinent History Reviewed:  Reviewed past medical,surgical, social, obstetrical and family history.  Reviewed problem list, medications and allergies. Physical Assessment:   Vitals:   09/13/18 0901  BP: 120/76  Pulse: 85  Weight: 159 lb 3.2 oz (72.2 kg)  Body mass index is 26.49 kg/m.        Physical Examination:   General appearance: Well appearing, and in no distress  Mental status: Alert, oriented to person, place, and time  Skin: Warm & dry  Cardiovascular: Normal heart rate noted  Respiratory: Normal respiratory effort, no distress  Abdomen: Soft, gravid, nontender  Pelvic: Cervical exam performed, thin prep pap obtained by Guido Sander, SNP         Extremities: Edema: None  Fetal Status: Fetal Heart Rate (bpm): 154 Fundal Height: 28 cm Movement: Present    Results for orders placed or performed in visit on 09/13/18 (from the past 24 hour(s))  POC Urinalysis Dipstick OB   Collection Time: 09/13/18  9:06 AM  Result Value Ref Range   Color, UA     Clarity, UA     Glucose, UA Negative Negative   Bilirubin, UA     Ketones, UA neg    Spec Grav, UA     Blood, UA neg    pH, UA     POC,PROTEIN,UA Negative Negative, Trace, Small (1+),  Moderate (2+), Large (3+), 4+   Urobilinogen, UA     Nitrite, UA neg    Leukocytes, UA Negative Negative   Appearance     Odor      Assessment & Plan:  1) Low-risk pregnancy G2P1001 at [redacted]w[redacted]d with an Estimated Date of Delivery: 11/26/18    Meds: No orders of the defined types were placed in this encounter.  Labs/procedures today: pn2, tdap, pap  Plan:  Continue routine obstetrical care   Reviewed: Preterm labor symptoms and general obstetric precautions including but not limited to vaginal bleeding, contractions, leaking of fluid and fetal movement were reviewed in detail with the patient.  All questions were answered. Has home bp cuff.  Check bp weekly, let us know if >140/90.   Follow-up: Return in about 1 week (around 09/20/2018) for rhogam w/ nurse, then 4wks for LROB , MyChart Video.  Orders Placed This Encounter  Procedures  . Tdap vaccine greater than or equal to 7yo IM  . POC Urinalysis Dipstick OB   Roma Schanz CNM, [facility] 09/13/2018 9:43 AM

## 2018-09-14 LAB — CBC
Hematocrit: 34.8 % (ref 34.0–46.6)
Hemoglobin: 12.2 g/dL (ref 11.1–15.9)
MCH: 31.6 pg (ref 26.6–33.0)
MCHC: 35.1 g/dL (ref 31.5–35.7)
MCV: 90 fL (ref 79–97)
Platelets: 187 10*3/uL (ref 150–450)
RBC: 3.86 x10E6/uL (ref 3.77–5.28)
RDW: 11.6 % — ABNORMAL LOW (ref 11.7–15.4)
WBC: 8.5 10*3/uL (ref 3.4–10.8)

## 2018-09-14 LAB — GLUCOSE TOLERANCE, 2 HOURS W/ 1HR
Glucose, 1 hour: 143 mg/dL (ref 65–179)
Glucose, 2 hour: 128 mg/dL (ref 65–152)
Glucose, Fasting: 85 mg/dL (ref 65–91)

## 2018-09-14 LAB — ANTIBODY SCREEN: Antibody Screen: NEGATIVE

## 2018-09-14 LAB — CYTOLOGY - PAP
Diagnosis: NEGATIVE
HPV: NOT DETECTED

## 2018-09-14 LAB — RPR: RPR Ser Ql: NONREACTIVE

## 2018-09-14 LAB — HIV ANTIBODY (ROUTINE TESTING W REFLEX): HIV Screen 4th Generation wRfx: NONREACTIVE

## 2018-09-20 ENCOUNTER — Other Ambulatory Visit: Payer: Self-pay

## 2018-09-20 ENCOUNTER — Ambulatory Visit (INDEPENDENT_AMBULATORY_CARE_PROVIDER_SITE_OTHER): Payer: BC Managed Care – PPO | Admitting: *Deleted

## 2018-09-20 VITALS — BP 104/66

## 2018-09-20 DIAGNOSIS — O360130 Maternal care for anti-D [Rh] antibodies, third trimester, not applicable or unspecified: Secondary | ICD-10-CM | POA: Diagnosis not present

## 2018-09-20 DIAGNOSIS — Z3483 Encounter for supervision of other normal pregnancy, third trimester: Secondary | ICD-10-CM

## 2018-09-20 DIAGNOSIS — Z6791 Unspecified blood type, Rh negative: Secondary | ICD-10-CM

## 2018-09-20 DIAGNOSIS — Z3A30 30 weeks gestation of pregnancy: Secondary | ICD-10-CM | POA: Diagnosis not present

## 2018-09-20 NOTE — Progress Notes (Signed)
Rhogam 311mcg given in left glut with no complications. Pt to return as scheduled.

## 2018-10-11 ENCOUNTER — Other Ambulatory Visit: Payer: Self-pay

## 2018-10-11 ENCOUNTER — Encounter: Payer: Self-pay | Admitting: Family Medicine

## 2018-10-11 ENCOUNTER — Telehealth (INDEPENDENT_AMBULATORY_CARE_PROVIDER_SITE_OTHER): Payer: BC Managed Care – PPO | Admitting: Family Medicine

## 2018-10-11 VITALS — BP 110/66 | HR 115 | Ht 64.5 in | Wt 163.0 lb

## 2018-10-11 DIAGNOSIS — Z3A33 33 weeks gestation of pregnancy: Secondary | ICD-10-CM

## 2018-10-11 DIAGNOSIS — Z3483 Encounter for supervision of other normal pregnancy, third trimester: Secondary | ICD-10-CM

## 2018-10-11 NOTE — Progress Notes (Signed)
I connected with@ on 10/11/18 at 10:30 AM EDT by: MYChart and verified that I am speaking with the correct person using two identifiers.  Patient is located at home and provider is located at [facility].     The purpose of this virtual visit is to provide medical care while limiting exposure to the novel coronavirus. I discussed the limitations, risks, security and privacy concerns of performing an evaluation and management service by Mychart and the availability of in person appointments. I also discussed with the patient that there may be a patient responsible charge related to this service. By engaging in this virtual visit, you consent to the provision of healthcare.  Additionally, you authorize for your insurance to be billed for the services provided during this visit.  The patient expressed understanding and agreed to proceed.  The following staff members participated in the virtual visit:  MyChart    PRENATAL VISIT NOTE  Subjective:  Jacqueline Johnson is a 23 y.o. G2P1001 at [redacted]w[redacted]d  for phone visit for ongoing prenatal care.  She is currently monitored for the following issues for this low-risk pregnancy and has Supervision of normal pregnancy on their problem list.  Patient reports backache.  Contractions: Not present. Vag. Bleeding: None.  Movement: Present. Denies leaking of fluid.   The following portions of the patient's history were reviewed and updated as appropriate: allergies, current medications, past family history, past medical history, past social history, past surgical history and problem list.   Objective:   Vitals:   10/11/18 1059  BP: 110/66  Pulse: (!) 115  Weight: 163 lb (73.9 kg)  Height: 5' 4.5" (1.638 m)   Self-Obtained  Fetal Status:     Movement: Present     Assessment and Plan:  Pregnancy: G2P1001 at [redacted]w[redacted]d 1. Encounter for supervision of other normal pregnancy in third trimester Up to date Discussed back pain relief with stretches, creams, massage, chiropractor care  Reviewed labor precautions Patient had questions about epidural (previously had natural/unmedicated birth but is worried about labor support) Discussed desire for circumcision- discussed office vs hospital and safety of both options.   Preterm labor symptoms and general obstetric precautions including but not limited to vaginal bleeding, contractions, leaking of fluid and fetal movement were reviewed in detail with the patient.  Return in about 3 weeks (around 11/01/2018) for Routine prenatal care, 36wks.  No future appointments.   Time spent on virtual visit: 15 minutes  Caren Macadam, MD

## 2018-11-01 ENCOUNTER — Encounter: Payer: Self-pay | Admitting: Advanced Practice Midwife

## 2018-11-01 ENCOUNTER — Other Ambulatory Visit: Payer: Self-pay

## 2018-11-01 ENCOUNTER — Other Ambulatory Visit (HOSPITAL_COMMUNITY)
Admission: RE | Admit: 2018-11-01 | Discharge: 2018-11-01 | Disposition: A | Discharge status: Home / Self Care | Payer: BC Managed Care – PPO | Source: Ambulatory Visit | Attending: Advanced Practice Midwife | Admitting: Advanced Practice Midwife

## 2018-11-01 ENCOUNTER — Ambulatory Visit (INDEPENDENT_AMBULATORY_CARE_PROVIDER_SITE_OTHER): Payer: BC Managed Care – PPO | Admitting: Advanced Practice Midwife

## 2018-11-01 VITALS — BP 125/83 | HR 96 | Wt 167.0 lb

## 2018-11-01 DIAGNOSIS — Z113 Encounter for screening for infections with a predominantly sexual mode of transmission: Secondary | ICD-10-CM | POA: Insufficient documentation

## 2018-11-01 DIAGNOSIS — Z331 Pregnant state, incidental: Secondary | ICD-10-CM

## 2018-11-01 DIAGNOSIS — Z3A36 36 weeks gestation of pregnancy: Secondary | ICD-10-CM

## 2018-11-01 DIAGNOSIS — Z3483 Encounter for supervision of other normal pregnancy, third trimester: Secondary | ICD-10-CM | POA: Insufficient documentation

## 2018-11-01 DIAGNOSIS — Z1389 Encounter for screening for other disorder: Secondary | ICD-10-CM

## 2018-11-01 LAB — POCT URINALYSIS DIPSTICK OB
Blood, UA: NEGATIVE
Glucose, UA: NEGATIVE
Ketones, UA: NEGATIVE
Leukocytes, UA: NEGATIVE
Nitrite, UA: NEGATIVE
POC,PROTEIN,UA: NEGATIVE

## 2018-11-01 LAB — OB RESULTS CONSOLE GBS: GBS: NEGATIVE

## 2018-11-01 NOTE — Progress Notes (Signed)
   LOW-RISK PREGNANCY VISIT Patient name: Jacqueline Johnson MRN [id number]  Date of birth: 29-Mar-1995 Chief Complaint:   Routine Prenatal Visit (GBS, GC/CHL)  History of Present Illness:   Jacqueline Johnson is a 23 y.o. G37P1001 female at [redacted]w[redacted]d with an Estimated Date of Delivery: 11/26/18 being seen today for ongoing management of a low-risk pregnancy.  Today she reports no complaints. Contractions: Not present. Vag. Bleeding: None.  Movement: Present. denies leaking of fluid. Review of Systems:   Pertinent items are noted in HPI Denies abnormal vaginal discharge w/ itching/odor/irritation, headaches, visual changes, shortness of breath, chest pain, abdominal pain, severe nausea/vomiting, or problems with urination or bowel movements unless otherwise stated above.  Pertinent History Reviewed:  Medical & Surgical Hx:   Past Medical History:  Diagnosis Date  . Medical history non-contributory    Past Surgical History:  Procedure Laterality Date  . WISDOM TOOTH EXTRACTION     Family History  Problem Relation Age of Onset  . Cancer Maternal Grandmother   . Other Maternal Grandfather        heart issues  . Diabetes Father   . Kidney Stones Mother   . Kidney Stones Sister   . Other Brother        passed away soon after birth  . Seizures Sister     Current Outpatient Medications:  .  Omeprazole (PRILOSEC PO), Take by mouth as needed., Disp: , Rfl:  .  Prenatal Vit-Fe Fumarate-FA (PRENATAL VITAMIN PO), Take by mouth daily., Disp: , Rfl:  Social History: Reviewed -  reports that she has never smoked. She has never used smokeless tobacco.  Physical Assessment:   Vitals:   11/01/18 1047  BP: 125/83  Pulse: 96  Weight: 167 lb (75.8 kg)  Body mass index is 28.22 kg/m.        Physical Examination:   General appearance: Well appearing, and in no distress  Mental status: Alert, oriented to person, place, and time  Skin: Warm & dry  Cardiovascular: Normal heart rate noted  Respiratory: Normal  respiratory effort, no distress  Abdomen: Soft, gravid, nontender  Pelvic: Cervical exam performed  Dilation: Closed Effacement (%): 50 Station: -2  Extremities: Edema: None  Fetal Status: Fetal Heart Rate (bpm): 150 Fundal Height: 38 cm Movement: Present Presentation: Vertex  Results for orders placed or performed in visit on 11/01/18 (from the past 24 hour(s))  POC Urinalysis Dipstick OB   Collection Time: 11/01/18 10:49 AM  Result Value Ref Range   Color, UA     Clarity, UA     Glucose, UA Negative Negative   Bilirubin, UA     Ketones, UA neg    Spec Grav, UA     Blood, UA neg    pH, UA     POC,PROTEIN,UA Negative Negative, Trace, Small (1+), Moderate (2+), Large (3+), 4+   Urobilinogen, UA     Nitrite, UA neg    Leukocytes, UA Negative Negative   Appearance     Odor      Assessment & Plan:  1) Low-risk pregnancy G2P1001 at [redacted]w[redacted]d with an Estimated Date of Delivery: 11/26/18    Labs/procedures/US today: declined flu shot  Plan:  Continue routine obstetrical care    Follow-up: Return in about 1 week (around 11/08/2018) for OB Mychart visit.  Orders Placed This Encounter  Procedures  . Culture, beta strep (group b only)  . POC Urinalysis Dipstick OB   [name] CNM 11/01/2018 11:28 AM

## 2018-11-01 NOTE — Patient Instructions (Signed)
Mike Gip, I greatly value your feedback.  If you receive a survey following your visit with [place] today, we appreciate you taking the time to fill it out.  Thanks, Nigel Berthold, DNP, CNM  [place]!!! It is now [place] at [facility] ([place], [place] [address]) Entrance located off of [address] parking   Go to [email or web address] to register for FREE online childbirth classes    Call the office [phone]) or go to [place] if:  You begin to have strong, frequent contractions  Your water breaks.  Sometimes it is a big gush of fluid, sometimes it is just a trickle that keeps getting your panties wet or running down your legs  You have vaginal bleeding.  It is normal to have a small amount of spotting if your cervix was checked.   You don't feel your baby moving like normal.  If you don't, get you something to eat and drink and lay down and focus on feeling your baby move.  You should feel at least 10 movements in 2 hours.  If you don't, you should call the office or go to [place] Blood Pressure Monitoring for Patients   Your provider has recommended that you check your blood pressure (BP) at least once a week at home. If you do not have a blood pressure cuff at home, one will be provided for you. Contact your provider if you have not received your monitor within 1 week.   Helpful Tips for Accurate Home Blood Pressure Checks  . Don't smoke, exercise, or drink caffeine 30 minutes before checking your BP . Use the restroom before checking your BP (a full bladder can raise your pressure) . Relax in a comfortable upright chair . Feet on the ground . Left arm resting comfortably on a flat surface at the level of your heart . Legs uncrossed . Back supported . Sit quietly and don't talk . Place the cuff on your bare arm . Adjust snuggly, so that only two fingertips can fit between  your skin and the top of the cuff . Check 2 readings separated by at least one minute . Keep a log of your BP readings . For a visual, please reference this diagram: http://ccnc.care/bpdiagram  Provider Name: [facility]     Phone: [phone]  Zone 1: ALL CLEAR  Continue to monitor your symptoms:  . BP reading is less than 140 (top number) or less than 90 (bottom number)  . No right upper stomach pain . No headaches or seeing spots . No feeling nauseated or throwing up . No swelling in face and hands  Zone 2: CAUTION Call your doctor's office for any of the following:  . BP reading is greater than 140 (top number) or greater than 90 (bottom number)  . Stomach pain under your ribs in the middle or right side . Headaches or seeing spots . Feeling nauseated or throwing up . Swelling in face and hands  Zone 3: EMERGENCY  Seek immediate medical care if you have any of the following:  . BP reading is greater than160 (top number) or greater than 110 (bottom number) . Severe headaches not improving with Tylenol . Serious difficulty catching your breath . Any worsening symptoms from Zone 2

## 2018-11-04 LAB — CERVICOVAGINAL ANCILLARY ONLY
Chlamydia: NEGATIVE
Neisseria Gonorrhea: NEGATIVE

## 2018-11-05 LAB — CULTURE, BETA STREP (GROUP B ONLY): Strep Gp B Culture: NEGATIVE

## 2018-11-08 ENCOUNTER — Encounter: Payer: Self-pay | Admitting: *Deleted

## 2018-11-09 ENCOUNTER — Telehealth (INDEPENDENT_AMBULATORY_CARE_PROVIDER_SITE_OTHER): Payer: BC Managed Care – PPO | Admitting: Women's Health

## 2018-11-09 ENCOUNTER — Other Ambulatory Visit: Payer: Self-pay

## 2018-11-09 ENCOUNTER — Encounter: Payer: Self-pay | Admitting: Women's Health

## 2018-11-09 DIAGNOSIS — Z3483 Encounter for supervision of other normal pregnancy, third trimester: Secondary | ICD-10-CM

## 2018-11-09 DIAGNOSIS — Z3A37 37 weeks gestation of pregnancy: Secondary | ICD-10-CM

## 2018-11-09 NOTE — Patient Instructions (Signed)
Jacqueline Johnson, I greatly value your feedback.  If you receive a survey following your visit with us today, we appreciate you taking the time to fill it out.  °Thanks, °Kim Timarie Labell, CNM, WHNP-BC ° °[facility] °It is now [facility] °([address] [place], [place] [address]) °Entrance located off of [address] °Free 24/7 valet parking  ° °Go to [email or web address] to register for FREE online childbirth classes  °  °Call the office ([phone]) or go to [facility] if: °· You begin to have strong, frequent contractions °· Your water breaks.  Sometimes it is a big gush of fluid, sometimes it is just a trickle that keeps getting your panties wet or running down your legs °· You have vaginal bleeding.  It is normal to have a small amount of spotting if your cervix was checked.  °· You don't feel your baby moving like normal.  If you don't, get you something to eat and drink and lay down and focus on feeling your baby move.  You should feel at least 10 movements in 2 hours.  If you don't, you should call the office or go to [facility].  ° °Home Blood Pressure Monitoring for Patients  ° °Your provider has recommended that you check your blood pressure (BP) at least once a week at home. If you do not have a blood pressure cuff at home, one will be provided for you. Contact your provider if you have not received your monitor within 1 week.  ° °Helpful Tips for Accurate Home Blood Pressure Checks  °• Don't smoke, exercise, or drink caffeine 30 minutes before checking your BP °• Use the restroom before checking your BP (a full bladder can raise your pressure) °• Relax in a comfortable upright chair °• Feet on the ground °• Left arm resting comfortably on a flat surface at the level of your heart °• Legs uncrossed °• Back supported °• Sit quietly and don't talk °• Place the cuff on your bare arm °• Adjust snuggly, so that only two fingertips can fit between your skin and the  top of the cuff °• Check 2 readings separated by at least one minute °• Keep a log of your BP readings °• For a visual, please reference this diagram: http://ccnc.care/bpdiagram ° °Provider Name: [facility]     Phone: [phone] ° °Zone 1: ALL CLEAR  °Continue to monitor your symptoms:  °• BP reading is less than 140 (top number) or less than 90 (bottom number)  °• No right upper stomach pain °• No headaches or seeing spots °• No feeling nauseated or throwing up °• No swelling in face and hands ° °Zone 2: CAUTION °Call your doctor's office for any of the following:  °• BP reading is greater than 140 (top number) or greater than 90 (bottom number)  °• Stomach pain under your ribs in the middle or right side °• Headaches or seeing spots °• Feeling nauseated or throwing up °• Swelling in face and hands ° °Zone 3: EMERGENCY  °Seek immediate medical care if you have any of the following:  °• BP reading is greater than160 (top number) or greater than 110 (bottom number) °• Severe headaches not improving with Tylenol °• Serious difficulty catching your breath °• Any worsening symptoms from Zone 2  ° ° °Braxton Hicks Contractions °Contractions of the uterus can occur throughout pregnancy, but they are not always a sign that you are in   labor. You may have practice contractions called Braxton Hicks contractions. These false labor contractions are sometimes confused with true labor. What are [place] contractions? Braxton Hicks contractions are tightening movements that occur in the muscles of the uterus before labor. Unlike true labor contractions, these contractions do not result in opening (dilation) and thinning of the cervix. Toward the end of pregnancy (32-34 weeks), Braxton Hicks contractions can happen more often and may become stronger. These contractions are sometimes difficult to tell apart from true labor because they can be very uncomfortable. You should not feel embarrassed if you go to the  hospital with false labor. Sometimes, the only way to tell if you are in true labor is for your health care provider to look for changes in the cervix. The health care provider will do a physical exam and may monitor your contractions. If you are not in true labor, the exam should show that your cervix is not dilating and your water has not broken. If there are no other health problems associated with your pregnancy, it is completely safe for you to be sent home with false labor. You may continue to have Braxton Hicks contractions until you go into true labor. How to tell the difference between true labor and false labor True labor  Contractions last 30-70 seconds.  Contractions become very regular.  Discomfort is usually felt in the top of the uterus, and it spreads to the lower abdomen and low back.  Contractions do not go away with walking.  Contractions usually become more intense and increase in frequency.  The cervix dilates and gets thinner. False labor  Contractions are usually shorter and not as strong as true labor contractions.  Contractions are usually irregular.  Contractions are often felt in the front of the lower abdomen and in the groin.  Contractions may go away when you walk around or change positions while lying down.  Contractions get weaker and are shorter-lasting as time goes on.  The cervix usually does not dilate or become thin. Follow these instructions at home:   Take over-the-counter and prescription medicines only as told by your health care provider.  Keep up with your usual exercises and follow other instructions from your health care provider.  Eat and drink lightly if you think you are going into labor.  If Braxton Hicks contractions are making you uncomfortable: ? Change your position from lying down or resting to walking, or change from walking to resting. ? Sit and rest in a tub of warm water. ? Drink enough fluid to keep your urine pale  yellow. Dehydration may cause these contractions. ? Do slow and deep breathing several times an hour.  Keep all follow-up prenatal visits as told by your health care provider. This is important. Contact a health care provider if:  You have a fever.  You have continuous pain in your abdomen. Get help right away if:  Your contractions become stronger, more regular, and closer together.  You have fluid leaking or gushing from your vagina.  You pass blood-tinged mucus (bloody show).  You have bleeding from your vagina.  You have low back pain that you never had before.  You feel your babys head pushing down and causing pelvic pressure.  Your baby is not moving inside you as much as it used to. Summary  Contractions that occur before labor are called Braxton Hicks contractions, false labor, or practice contractions.  Braxton Hicks contractions are usually shorter, weaker, farther apart, and less  regular than true labor contractions. True labor contractions usually become progressively stronger and regular, and they become more frequent. °· Manage discomfort from Braxton Hicks contractions by changing position, resting in a warm bath, drinking plenty of water, or practicing deep breathing. °This information is not intended to replace advice given to you by your health care provider. Make sure you discuss any questions you have with your health care provider. °Document Released: 05/29/2016 Document Revised: 12/26/2016 Document Reviewed: 05/29/2016 °Elsevier Patient Education © 2020 Elsevier Inc. ° °

## 2018-11-09 NOTE — Progress Notes (Signed)
   TELEHEALTH VIRTUAL OBSTETRICS VISIT ENCOUNTER NOTE Patient name: Jacqueline Johnson MRN [id number]  Date of birth: [birth year]  I connected with patient on 11/09/18 at  1:30 PM EDT by MyChart video  and verified that I am speaking with the correct person using two identifiers. Due to COVID-19 recommendations, pt is not currently in our office.    I discussed the limitations, risks, security and privacy concerns of performing an evaluation and management service by telephone and the availability of in person appointments. I also discussed with the patient that there may be a patient responsible charge related to this service. The patient expressed understanding and agreed to proceed.  Chief Complaint:   Routine Prenatal Visit  History of Present Illness:   Jacqueline Johnson is a 23 y.o. G2P1001 female at [redacted]w[redacted]d with an Estimated Date of Delivery: 11/26/18 being evaluated today for ongoing management of a low-risk pregnancy.  Today she reports no complaints. Contractions: Not present. Vag. Bleeding: None.  Movement: Present. denies leaking of fluid. Review of Systems:   Pertinent items are noted in HPI Denies abnormal vaginal discharge w/ itching/odor/irritation, headaches, visual changes, shortness of breath, chest pain, abdominal pain, severe nausea/vomiting, or problems with urination or bowel movements unless otherwise stated above. Pertinent History Reviewed:  Reviewed past medical,surgical, social, obstetrical and family history.  Reviewed problem list, medications and allergies. Physical Assessment:   Vitals:   11/09/18 1334  BP: 108/69  Pulse: 86  Weight: 171 lb (77.6 kg)  Body mass index is 28.9 kg/m.        Physical Examination:   General:  Alert, oriented and cooperative.   Mental Status: Normal mood and affect perceived. Normal judgment and thought content.  Rest of physical exam deferred due to type of encounter  No results found for this or any previous visit (from the past 24 hour(s)).   Assessment & Plan:  1) Pregnancy G2P1001 at [redacted]w[redacted]d with an Estimated Date of Delivery: 11/26/18    Meds: No orders of the defined types were placed in this encounter.   Labs/procedures today: none. Declined flu shot last visit. Declined flu shot. She was advised that the flu shot is recommended during pregnancy to help protect her and her baby. We discussed that it is an inactivated vaccine-so side effects are minimal, it is considered safe to receive during any trimester, and pregnant women are at a higher risk of developing potential complications from the flu.    Plan:  Continue routine obstetrical care.  Has home bp cuff.  Check bp weekly, let us know if >140/90.   Reviewed: Term labor symptoms and general obstetric precautions including but not limited to vaginal bleeding, contractions, leaking of fluid and fetal movement were reviewed in detail with the patient. The patient was advised to call back or seek an in-person office evaluation/go to MAU at [facility] for any urgent or concerning symptoms. All questions were answered. Please refer to After Visit Summary for other counseling recommendations.    I provided 15 minutes of non-face-to-face time during this encounter.  Follow-up: Return in about 1 week (around 11/16/2018) for [place], [place].  No orders of the defined types were placed in this encounter.  [place], [facility] 11/09/2018 1:55 PM

## 2018-11-16 ENCOUNTER — Other Ambulatory Visit: Payer: Self-pay

## 2018-11-16 ENCOUNTER — Encounter: Payer: Self-pay | Admitting: Women's Health

## 2018-11-16 ENCOUNTER — Telehealth (INDEPENDENT_AMBULATORY_CARE_PROVIDER_SITE_OTHER): Payer: BC Managed Care – PPO | Admitting: Women's Health

## 2018-11-16 VITALS — BP 117/79 | HR 92 | Wt 170.0 lb

## 2018-11-16 DIAGNOSIS — Z3A38 38 weeks gestation of pregnancy: Secondary | ICD-10-CM

## 2018-11-16 DIAGNOSIS — Z3483 Encounter for supervision of other normal pregnancy, third trimester: Secondary | ICD-10-CM

## 2018-11-16 NOTE — Patient Instructions (Signed)
Mike Gip, I greatly value your feedback.  If you receive a survey following your visit with [place] today, we appreciate you taking the time to fill it out.  Thanks, Knute Neu, CNM, [facility]  [place]!!! It is now [facility] ([place], [place] [address]) Entrance located off of [address] parking   Go to [email or web address] to register for FREE online childbirth classes    Call the office [phone]) or go to [facility] if:  You begin to have strong, frequent contractions  Your water breaks.  Sometimes it is a big gush of fluid, sometimes it is just a trickle that keeps getting your panties wet or running down your legs  You have vaginal bleeding.  It is normal to have a small amount of spotting if your cervix was checked.   You don't feel your baby moving like normal.  If you don't, get you something to eat and drink and lay down and focus on feeling your baby move.  You should feel at least 10 movements in 2 hours.  If you don't, you should call the office or go to [place] Blood Pressure Monitoring for Patients   Your provider has recommended that you check your blood pressure (BP) at least once a week at home. If you do not have a blood pressure cuff at home, one will be provided for you. Contact your provider if you have not received your monitor within 1 week.   Helpful Tips for Accurate Home Blood Pressure Checks   Don't smoke, exercise, or drink caffeine 30 minutes before checking your BP  Use the restroom before checking your BP (a full bladder can raise your pressure)  Relax in a comfortable upright chair  Feet on the ground  Left arm resting comfortably on a flat surface at the level of your heart  Legs uncrossed  Back supported  Sit quietly and don't talk  Place the cuff on your bare arm  Adjust snuggly, so that only two fingertips can fit between your skin and the  top of the cuff  Check 2 readings separated by at least one minute  Keep a log of your BP readings  For a visual, please reference this diagram: http://ccnc.care/bpdiagram  Provider Name: [facility]     Phone: [phone]  Zone 1: ALL CLEAR  Continue to monitor your symptoms:   BP reading is less than 140 (top number) or less than 90 (bottom number)   No right upper stomach pain  No headaches or seeing spots  No feeling nauseated or throwing up  No swelling in face and hands  Zone 2: CAUTION Call your doctor's office for any of the following:   BP reading is greater than 140 (top number) or greater than 90 (bottom number)   Stomach pain under your ribs in the middle or right side  Headaches or seeing spots  Feeling nauseated or throwing up  Swelling in face and hands  Zone 3: EMERGENCY  Seek immediate medical care if you have any of the following:   BP reading is greater than160 (top number) or greater than 110 (bottom number)  Severe headaches not improving with Tylenol  Serious difficulty catching your breath  Any worsening symptoms from Zone 2    Braxton Hicks Contractions Contractions of the uterus can occur throughout pregnancy, but they are not always a sign that you are in  labor. You may have practice contractions called Braxton Hicks contractions. These false labor contractions are sometimes confused with true labor. What are [place] contractions? Braxton Hicks contractions are tightening movements that occur in the muscles of the uterus before labor. Unlike true labor contractions, these contractions do not result in opening (dilation) and thinning of the cervix. Toward the end of pregnancy (32-34 weeks), Braxton Hicks contractions can happen more often and may become stronger. These contractions are sometimes difficult to tell apart from true labor because they can be very uncomfortable. You should not feel embarrassed if you go to the  hospital with false labor. Sometimes, the only way to tell if you are in true labor is for your health care provider to look for changes in the cervix. The health care provider will do a physical exam and may monitor your contractions. If you are not in true labor, the exam should show that your cervix is not dilating and your water has not broken. If there are no other health problems associated with your pregnancy, it is completely safe for you to be sent home with false labor. You may continue to have Braxton Hicks contractions until you go into true labor. How to tell the difference between true labor and false labor True labor  Contractions last 30-70 seconds.  Contractions become very regular.  Discomfort is usually felt in the top of the uterus, and it spreads to the lower abdomen and low back.  Contractions do not go away with walking.  Contractions usually become more intense and increase in frequency.  The cervix dilates and gets thinner. False labor  Contractions are usually shorter and not as strong as true labor contractions.  Contractions are usually irregular.  Contractions are often felt in the front of the lower abdomen and in the groin.  Contractions may go away when you walk around or change positions while lying down.  Contractions get weaker and are shorter-lasting as time goes on.  The cervix usually does not dilate or become thin. Follow these instructions at home:   Take over-the-counter and prescription medicines only as told by your health care provider.  Keep up with your usual exercises and follow other instructions from your health care provider.  Eat and drink lightly if you think you are going into labor.  If Braxton Hicks contractions are making you uncomfortable: ? Change your position from lying down or resting to walking, or change from walking to resting. ? Sit and rest in a tub of warm water. ? Drink enough fluid to keep your urine pale  yellow. Dehydration may cause these contractions. ? Do slow and deep breathing several times an hour.  Keep all follow-up prenatal visits as told by your health care provider. This is important. Contact a health care provider if:  You have a fever.  You have continuous pain in your abdomen. Get help right away if:  Your contractions become stronger, more regular, and closer together.  You have fluid leaking or gushing from your vagina.  You pass blood-tinged mucus (bloody show).  You have bleeding from your vagina.  You have low back pain that you never had before.  You feel your babys head pushing down and causing pelvic pressure.  Your baby is not moving inside you as much as it used to. Summary  Contractions that occur before labor are called Braxton Hicks contractions, false labor, or practice contractions.  Braxton Hicks contractions are usually shorter, weaker, farther apart, and less  regular than true labor contractions. True labor contractions usually become progressively stronger and regular, and they become more frequent.  Manage discomfort from [facility] contractions by changing position, resting in a warm bath, drinking plenty of water, or practicing deep breathing. This information is not intended to replace advice given to you by your health care provider. Make sure you discuss any questions you have with your health care provider. Document Released: 05/29/2016 Document Revised: 12/26/2016 Document Reviewed: 05/29/2016 Elsevier Patient Education  2020 [employer].

## 2018-11-16 NOTE — Progress Notes (Signed)
   TELEHEALTH VIRTUAL OBSTETRICS VISIT ENCOUNTER NOTE Patient name: Jacqueline Johnson MRN [id number]  Date of birth: 1995-03-17  I connected with patient on 11/16/18 at  1:50 PM EDT by MyChart video  and verified that I am speaking with the correct person using two identifiers. Due to COVID-19 recommendations, pt is not currently in our office.    I discussed the limitations, risks, security and privacy concerns of performing an evaluation and management service by telephone and the availability of in person appointments. I also discussed with the patient that there may be a patient responsible charge related to this service. The patient expressed understanding and agreed to proceed.  Chief Complaint:   Routine Prenatal Visit  History of Present Illness:   Jacqueline Johnson is a 23 y.o. G60P1001 female at [redacted]w[redacted]d with an Estimated Date of Delivery: 11/26/18 being evaluated today for ongoing management of a low-risk pregnancy.  Today she reports no complaints. Contractions: Not present.  .  Movement: Present. denies leaking of fluid. Review of Systems:   Pertinent items are noted in HPI Denies abnormal vaginal discharge w/ itching/odor/irritation, headaches, visual changes, shortness of breath, chest pain, abdominal pain, severe nausea/vomiting, or problems with urination or bowel movements unless otherwise stated above. Pertinent History Reviewed:  Reviewed past medical,surgical, social, obstetrical and family history.  Reviewed problem list, medications and allergies. Physical Assessment:   Vitals:   11/16/18 1358  BP: 117/79  Pulse: 92  Weight: 170 lb (77.1 kg)  Body mass index is 28.73 kg/m.        Physical Examination:   General:  Alert, oriented and cooperative.   Mental Status: Normal mood and affect perceived. Normal judgment and thought content.  Rest of physical exam deferred due to type of encounter  No results found for this or any previous visit (from the past 24 hour(s)).  Assessment &  Plan:  1) Pregnancy G2P1001 at [redacted]w[redacted]d with an Estimated Date of Delivery: 11/26/18    Meds: No orders of the defined types were placed in this encounter.  Labs/procedures today: none  Plan:  Continue routine obstetrical care.  Has home bp cuff.  Check bp weekly, let us know if >140/90.  Next visit: prefers online    Reviewed: Term labor symptoms and general obstetric precautions including but not limited to vaginal bleeding, contractions, leaking of fluid and fetal movement were reviewed in detail with the patient. The patient was advised to call back or seek an in-person office evaluation/go to MAU at [facility] for any urgent or concerning symptoms. All questions were answered. Please refer to After Visit Summary for other counseling recommendations.    I provided 15 minutes of non-face-to-face time during this encounter.  Follow-up: Return in about 1 week (around 11/23/2018) for [place], [place], CNM.  No orders of the defined types were placed in this encounter.  [place], [facility] 11/16/2018 2:19 PM

## 2018-11-24 ENCOUNTER — Encounter (HOSPITAL_COMMUNITY): Payer: Self-pay | Admitting: *Deleted

## 2018-11-24 ENCOUNTER — Telehealth (HOSPITAL_COMMUNITY): Payer: Self-pay | Admitting: *Deleted

## 2018-11-24 ENCOUNTER — Encounter: Payer: Self-pay | Admitting: Advanced Practice Midwife

## 2018-11-24 ENCOUNTER — Telehealth (INDEPENDENT_AMBULATORY_CARE_PROVIDER_SITE_OTHER): Payer: BC Managed Care – PPO | Admitting: Advanced Practice Midwife

## 2018-11-24 ENCOUNTER — Other Ambulatory Visit: Payer: Self-pay

## 2018-11-24 VITALS — BP 113/87 | HR 80 | Ht 64.5 in | Wt 174.0 lb

## 2018-11-24 DIAGNOSIS — Z3A39 39 weeks gestation of pregnancy: Secondary | ICD-10-CM

## 2018-11-24 DIAGNOSIS — O26893 Other specified pregnancy related conditions, third trimester: Secondary | ICD-10-CM

## 2018-11-24 DIAGNOSIS — Z3483 Encounter for supervision of other normal pregnancy, third trimester: Secondary | ICD-10-CM

## 2018-11-24 DIAGNOSIS — Z6791 Unspecified blood type, Rh negative: Secondary | ICD-10-CM | POA: Insufficient documentation

## 2018-11-24 NOTE — Addendum Note (Signed)
Addended by: Serita Grammes D on: 11/24/2018 01:12 PM   Modules accepted: Orders

## 2018-11-24 NOTE — Addendum Note (Signed)
Addended by: Serita Grammes D on: 11/24/2018 01:48 PM   Modules accepted: Orders, SmartSet

## 2018-11-24 NOTE — Progress Notes (Addendum)
   TELEHEALTH VIRTUAL OBSTETRICS VISIT ENCOUNTER NOTE Patient name: Jacqueline Johnson MRN [id number]  Date of birth: [birth year]  I connected with patient on 11/24/18 at 10:30 AM EDT by My Chart and verified that I am speaking with the correct person using two identifiers. Due to COVID-19 recommendations, pt is not currently in our office.    I discussed the limitations, risks, security and privacy concerns of performing an evaluation and management service by telephone and the availability of in person appointments. I also discussed with the patient that there may be a patient responsible charge related to this service. The patient expressed understanding and agreed to proceed.  Chief Complaint:   Routine Prenatal Visit  History of Present Illness:   Jacqueline Johnson is a 23 y.o. G29P1001 female at [redacted]w[redacted]d with an Estimated Date of Delivery: 11/26/18 being evaluated today for ongoing management of a low-risk pregnancy.  Today she reports intermittent dull abd ache around umbilicus. Contractions: Not present. Vag. Bleeding: None.  Movement: Present. denies leaking of fluid. Review of Systems:   Pertinent items are noted in HPI Denies abnormal vaginal discharge w/ itching/odor/irritation, headaches, visual changes, shortness of breath, chest pain, abdominal pain, severe nausea/vomiting, or problems with urination or bowel movements unless otherwise stated above. Pertinent History Reviewed:  Reviewed past medical,surgical, social, obstetrical and family history.  Reviewed problem list, medications and allergies. Physical Assessment:   Vitals:   11/24/18 1028  BP: 113/87  Pulse: 80  Weight: 174 lb (78.9 kg)  Height: 5' 4.5" (1.638 m)  Body mass index is 29.41 kg/m.        Physical Examination:   General:  Alert, oriented and cooperative.   Mental Status: Normal mood and affect perceived. Normal judgment and thought content.  Rest of physical exam deferred due to type of encounter  No results found for  this or any previous visit (from the past 24 hour(s)).  Assessment & Plan:  1) Pregnancy G2P1001 at [redacted]w[redacted]d with an Estimated Date of Delivery: 11/26/18   2) Rh neg, received Rhogam on 09/20/18   Meds: No orders of the defined types were placed in this encounter.   Labs/procedures today: none  Plan:  Continue routine obstetrical care- will get NST only on Friday (40wks).  Will also schedule IOL for 41wks with in-office cervical foley placement the day before. If she feels uneasy re the 41wk IOL, will have a BPP/AFI scheduled with plan to delay the induction until later in the 41st week. Has home bp cuff.  Check bp weekly, let us know if >140/90.  Next visit: prefers in person    Reviewed: Term labor symptoms and general obstetric precautions including but not limited to vaginal bleeding, contractions, leaking of fluid and fetal movement were reviewed in detail with the patient. The patient was advised to call back or seek an in-person office evaluation/go to [facility]U at [facility] for any urgent or concerning symptoms. All questions were answered. Please refer to After Visit Summary for other counseling recommendations.    IOL form faxed via Epic and orders placed (IOL 12/04/18 at midnight- arrive at 2345 on 11/6)   I provided 14 minutes of non-face-to-face time during this encounter.  Follow-up: No follow-ups on file.  No orders of the defined types were placed in this encounter.  Myrtis Ser CNM 11/24/2018 11:15 AM

## 2018-11-24 NOTE — Telephone Encounter (Signed)
Preadmission screen  

## 2018-11-24 NOTE — Progress Notes (Signed)
Induction Assessment Scheduling Form Fax to [facility]:  [phone]  Ventura Leggitt                                                                                   DOB:  11-Aug-1995                                                            MRN:  [id number]                                                                     Phone #:  [phone]                        Provider:  [facility]  GP:  [id number]                                                            Estimated Date of Delivery: 11/26/18  Dating Criteria: 7wk scan    Medical Indications for induction:  postdates Admission Date/Time:  12/04/18 midnight Gestational age on admission:  41.1wks   Filed Weights   11/24/18 1028  Weight: 174 lb (78.9 kg)   HIV:  Non Reactive (08/17 0845) GBS: Negative/-- (10/05 1150)   Method of induction(proposed):  outpt cervical foley, then Pit   Scheduling Provider Signature:  Myrtis Ser, CNM                                            Today's Date:  11/24/2018

## 2018-11-29 ENCOUNTER — Inpatient Hospital Stay (HOSPITAL_COMMUNITY)
Admission: AD | Admit: 2018-11-29 | Discharge: 2018-12-01 | DRG: 807 | Disposition: A | Discharge status: Home / Self Care | Payer: BC Managed Care – PPO | Attending: Obstetrics & Gynecology | Admitting: Obstetrics & Gynecology

## 2018-11-29 ENCOUNTER — Encounter (HOSPITAL_COMMUNITY): Payer: Self-pay

## 2018-11-29 ENCOUNTER — Other Ambulatory Visit: Payer: Self-pay

## 2018-11-29 ENCOUNTER — Inpatient Hospital Stay (HOSPITAL_COMMUNITY): Payer: BC Managed Care – PPO | Admitting: Anesthesiology

## 2018-11-29 ENCOUNTER — Other Ambulatory Visit: Payer: BC Managed Care – PPO

## 2018-11-29 DIAGNOSIS — O26893 Other specified pregnancy related conditions, third trimester: Secondary | ICD-10-CM | POA: Diagnosis present

## 2018-11-29 DIAGNOSIS — Z20828 Contact with and (suspected) exposure to other viral communicable diseases: Secondary | ICD-10-CM | POA: Diagnosis present

## 2018-11-29 DIAGNOSIS — Z3481 Encounter for supervision of other normal pregnancy, first trimester: Secondary | ICD-10-CM

## 2018-11-29 DIAGNOSIS — Z6791 Unspecified blood type, Rh negative: Secondary | ICD-10-CM | POA: Diagnosis not present

## 2018-11-29 DIAGNOSIS — O480 Post-term pregnancy: Principal | ICD-10-CM | POA: Diagnosis present

## 2018-11-29 DIAGNOSIS — Z3A41 41 weeks gestation of pregnancy: Secondary | ICD-10-CM | POA: Diagnosis present

## 2018-11-29 DIAGNOSIS — Z3A40 40 weeks gestation of pregnancy: Secondary | ICD-10-CM

## 2018-11-29 LAB — CBC
HCT: 34.2 % — ABNORMAL LOW (ref 36.0–46.0)
Hemoglobin: 11.7 g/dL — ABNORMAL LOW (ref 12.0–15.0)
MCH: 30.6 pg (ref 26.0–34.0)
MCHC: 34.2 g/dL (ref 30.0–36.0)
MCV: 89.5 fL (ref 80.0–100.0)
Platelets: 166 10*3/uL (ref 150–400)
RBC: 3.82 MIL/uL — ABNORMAL LOW (ref 3.87–5.11)
RDW: 12.7 % (ref 11.5–15.5)
WBC: 7.6 10*3/uL (ref 4.0–10.5)
nRBC: 0 % (ref 0.0–0.2)

## 2018-11-29 LAB — SARS CORONAVIRUS 2 BY RT PCR (HOSPITAL ORDER, PERFORMED IN ~~LOC~~ HOSPITAL LAB): SARS Coronavirus 2: NEGATIVE

## 2018-11-29 MED ORDER — OXYCODONE-ACETAMINOPHEN 5-325 MG PO TABS: 2.0000 | ORAL_TABLET | ORAL | Status: DC | PRN

## 2018-11-29 MED ORDER — SOD CITRATE-CITRIC ACID 500-334 MG/5ML PO SOLN
30.0000 mL | ORAL | Status: DC | PRN
  Filled 2018-11-29: qty 30

## 2018-11-29 MED ORDER — OXYCODONE-ACETAMINOPHEN 5-325 MG PO TABS: 1.0000 | ORAL_TABLET | ORAL | Status: DC | PRN

## 2018-11-29 MED ORDER — EPHEDRINE 5 MG/ML INJ: 10.0000 mg | INTRAVENOUS | Status: DC | PRN

## 2018-11-29 MED ORDER — LACTATED RINGERS IV SOLN: INTRAVENOUS | Status: DC

## 2018-11-29 MED ORDER — OXYTOCIN 40 UNITS IN NORMAL SALINE INFUSION - SIMPLE MED
1.0000 m[IU]/min | INTRAVENOUS | Status: DC
  Administered 2018-11-29: 15:00:00 1 m[IU]/min via INTRAVENOUS

## 2018-11-29 MED ORDER — DIPHENHYDRAMINE HCL 25 MG PO CAPS: 25.0000 mg | ORAL_CAPSULE | Freq: Four times a day (QID) | ORAL | Status: DC | PRN

## 2018-11-29 MED ORDER — LIDOCAINE HCL (PF) 1 % IJ SOLN: 30.0000 mL | INTRAMUSCULAR | Status: DC | PRN

## 2018-11-29 MED ORDER — ACETAMINOPHEN 325 MG PO TABS: 650.0000 mg | ORAL_TABLET | ORAL | Status: DC | PRN

## 2018-11-29 MED ORDER — LACTATED RINGERS IV SOLN
INTRAVENOUS | Status: DC
  Administered 2018-11-29 (×2): via INTRAVENOUS

## 2018-11-29 MED ORDER — OXYTOCIN BOLUS FROM INFUSION: 500.0000 mL | Freq: Once | INTRAVENOUS | Status: DC

## 2018-11-29 MED ORDER — LACTATED RINGERS IV SOLN
500.0000 mL | Freq: Once | INTRAVENOUS | Status: AC
  Administered 2018-11-29: 10:00:00 500 mL via INTRAVENOUS

## 2018-11-29 MED ORDER — DIPHENHYDRAMINE HCL 50 MG/ML IJ SOLN: 12.5000 mg | INTRAMUSCULAR | Status: DC | PRN

## 2018-11-29 MED ORDER — TERBUTALINE SULFATE 1 MG/ML IJ SOLN: 0.2500 mg | Freq: Once | INTRAMUSCULAR | Status: DC | PRN

## 2018-11-29 MED ORDER — WITCH HAZEL-GLYCERIN EX PADS: 1.0000 "application " | MEDICATED_PAD | CUTANEOUS | Status: DC | PRN

## 2018-11-29 MED ORDER — FENTANYL-BUPIVACAINE-NACL 0.5-0.125-0.9 MG/250ML-% EP SOLN
12.0000 mL/h | EPIDURAL | Status: DC | PRN
  Filled 2018-11-29: qty 250

## 2018-11-29 MED ORDER — LACTATED RINGERS IV SOLN: 500.0000 mL | INTRAVENOUS | Status: DC | PRN

## 2018-11-29 MED ORDER — OXYTOCIN 40 UNITS IN NORMAL SALINE INFUSION - SIMPLE MED: 2.5000 [IU]/h | INTRAVENOUS | Status: DC

## 2018-11-29 MED ORDER — ONDANSETRON HCL 4 MG/2ML IJ SOLN: 4.0000 mg | Freq: Four times a day (QID) | INTRAMUSCULAR | Status: DC | PRN

## 2018-11-29 MED ORDER — SODIUM CHLORIDE (PF) 0.9 % IJ SOLN
INTRAMUSCULAR | Status: DC | PRN
  Administered 2018-11-29: 12 mL/h via EPIDURAL

## 2018-11-29 MED ORDER — MAGNESIUM HYDROXIDE 400 MG/5ML PO SUSP: 30.0000 mL | ORAL | Status: DC | PRN

## 2018-11-29 MED ORDER — OXYTOCIN 40 UNITS IN NORMAL SALINE INFUSION - SIMPLE MED
2.5000 [IU]/h | INTRAVENOUS | Status: DC
  Filled 2018-11-29: qty 1000

## 2018-11-29 MED ORDER — FERROUS SULFATE 325 (65 FE) MG PO TABS
325.0000 mg | ORAL_TABLET | Freq: Two times a day (BID) | ORAL | Status: DC
  Administered 2018-11-29 – 2018-12-01 (×4): 325 mg via ORAL
  Filled 2018-11-29 (×4): qty 1

## 2018-11-29 MED ORDER — LIDOCAINE HCL (PF) 1 % IJ SOLN
INTRAMUSCULAR | Status: DC | PRN
  Administered 2018-11-29: 11 mL via EPIDURAL

## 2018-11-29 MED ORDER — ONDANSETRON HCL 4 MG/2ML IJ SOLN: 4.0000 mg | INTRAMUSCULAR | Status: DC | PRN

## 2018-11-29 MED ORDER — PRENATAL MULTIVITAMIN CH
1.0000 | ORAL_TABLET | Freq: Every day | ORAL | Status: DC
  Administered 2018-11-30 – 2018-12-01 (×2): 1 via ORAL
  Filled 2018-11-29 (×2): qty 1

## 2018-11-29 MED ORDER — DIBUCAINE (PERIANAL) 1 % EX OINT: 1.0000 "application " | TOPICAL_OINTMENT | CUTANEOUS | Status: DC | PRN

## 2018-11-29 MED ORDER — IBUPROFEN 600 MG PO TABS
600.0000 mg | ORAL_TABLET | Freq: Four times a day (QID) | ORAL | Status: DC
  Administered 2018-11-29 – 2018-12-01 (×8): 600 mg via ORAL
  Filled 2018-11-29 (×8): qty 1

## 2018-11-29 MED ORDER — SOD CITRATE-CITRIC ACID 500-334 MG/5ML PO SOLN: 30.0000 mL | ORAL | Status: DC | PRN

## 2018-11-29 MED ORDER — ONDANSETRON HCL 4 MG PO TABS: 4.0000 mg | ORAL_TABLET | ORAL | Status: DC | PRN

## 2018-11-29 MED ORDER — OXYTOCIN BOLUS FROM INFUSION
500.0000 mL | Freq: Once | INTRAVENOUS | Status: AC
  Administered 2018-11-29: 16:00:00 500 mL via INTRAVENOUS

## 2018-11-29 MED ORDER — PHENYLEPHRINE 40 MCG/ML (10ML) SYRINGE FOR IV PUSH (FOR BLOOD PRESSURE SUPPORT): 80.0000 ug | PREFILLED_SYRINGE | INTRAVENOUS | Status: DC | PRN

## 2018-11-29 MED ORDER — COCONUT OIL OIL
1.0000 "application " | TOPICAL_OIL | Status: DC | PRN
  Administered 2018-11-29: 1 via TOPICAL

## 2018-11-29 MED ORDER — TETANUS-DIPHTH-ACELL PERTUSSIS 5-2.5-18.5 LF-MCG/0.5 IM SUSP: 0.5000 mL | Freq: Once | INTRAMUSCULAR | Status: DC

## 2018-11-29 MED ORDER — MEASLES, MUMPS & RUBELLA VAC IJ SOLR: 0.5000 mL | Freq: Once | INTRAMUSCULAR | Status: DC

## 2018-11-29 MED ORDER — FENTANYL CITRATE (PF) 100 MCG/2ML IJ SOLN: 100.0000 ug | INTRAMUSCULAR | Status: DC | PRN

## 2018-11-29 MED ORDER — SIMETHICONE 80 MG PO CHEW: 80.0000 mg | CHEWABLE_TABLET | ORAL | Status: DC | PRN

## 2018-11-29 NOTE — Anesthesia Procedure Notes (Signed)
Epidural Patient location during procedure: OB Start time: 11/29/2018 10:28 AM End time: 11/29/2018 10:52 AM  Staffing Anesthesiologist: Lynda Rainwater, MD Performed: anesthesiologist   Preanesthetic Checklist Completed: patient identified, site marked, surgical consent, pre-op evaluation, timeout performed, IV checked, risks and benefits discussed and monitors and equipment checked  Epidural Patient position: sitting Prep: ChloraPrep Patient monitoring: heart rate, cardiac monitor, continuous pulse ox and blood pressure Approach: midline Location: L2-L3 Injection technique: LOR saline  Needle:  Needle type: Tuohy  Needle gauge: 17 G Needle length: 9 cm Needle insertion depth: 6 cm Catheter type: closed end flexible Catheter size: 20 Guage Catheter at skin depth: 10 cm Test dose: negative  Assessment Events: blood not aspirated, injection not painful, no injection resistance, negative IV test and no paresthesia  Additional Notes Reason for block:procedure for pain

## 2018-11-29 NOTE — H&P (Signed)
OBSTETRIC ADMISSION HISTORY AND PHYSICAL  Jacqueline Johnson is a 23 y.o. female G2P1001 with IUP at [redacted]w[redacted]d presenting for SOL and contractions that started around 0430. She had one episode of bright red blood running down her leg, but has not had any since. She reports +FMs. No LOF, VB, blurry vision, headaches, peripheral edema, or RUQ pain. She plans on breastfeeding. She is going to [place] condoms and natural family planning for birth control.  Dating: By 7 week [place] --->  Estimated Date of Delivery: 11/26/18  Sono:   @[redacted]w[redacted]d , CWD, normal anatomy, cephalic presentation, EFW 328g, 70%ile   Prenatal History/Complications: Rh negative, Rhogam 8/24  Past Medical History: Past Medical History:  Diagnosis Date  . Medical history non-contributory     Past Surgical History: Past Surgical History:  Procedure Laterality Date  . WISDOM TOOTH EXTRACTION      Obstetrical History: OB History    Gravida  2   Para  1   Term  1   Preterm  0   AB  0   Living  1     SAB  0   TAB  0   Ectopic  0   Multiple      Live Births  1           Social History: Social History   Socioeconomic History  . Marital status: Married    Spouse name: Not on file  . Number of children: 1  . Years of education: Not on file  . Highest education level: Not on file  Occupational History  . Occupation: CNA  Social Needs  . Financial resource strain: Not on file  . Food insecurity    Worry: Not on file    Inability: Not on file  . Transportation needs    Medical: Not on file    Non-medical: Not on file  Tobacco Use  . Smoking status: Never Smoker  . Smokeless tobacco: Never Used  Substance and Sexual Activity  . Alcohol use: No  . Drug use: No  . Sexual activity: Yes    Birth control/protection: None  Lifestyle  . Physical activity    Days per week: Not on file    Minutes per session: Not on file  . Stress: Not on file  Relationships  . Social Herbalist on phone: Not on file     Gets together: Not on file    Attends religious service: Not on file    Active member of club or organization: Not on file    Attends meetings of clubs or organizations: Not on file    Relationship status: Not on file  Other Topics Concern  . Not on file  Social History Narrative   ** Merged History Encounter **        Family History: Family History  Problem Relation Age of Onset  . Cancer Maternal Grandmother   . Other Maternal Grandfather        heart issues  . Cancer Maternal Grandfather   . Diabetes Father   . Kidney Stones Mother   . Kidney Stones Sister   . Other Brother        passed away soon after birth  . Seizures Sister     Allergies: Allergies  Allergen Reactions  . Icy Hot Rash    Ingredient in icy hot cause rash    Medications Prior to Admission  Medication Sig Dispense Refill Last Dose  . famotidine (PEPCID) 10 MG tablet  Take 10 mg by mouth 2 (two) times daily.   11/28/2018 at Unknown time  . Prenatal Vit-Fe Fumarate-FA (PRENATAL VITAMIN PO) Take by mouth daily.   11/28/2018 at Unknown time  . Omeprazole (PRILOSEC PO) Take by mouth as needed.        Review of Systems   All systems reviewed and negative except as stated in HPI  Blood pressure 131/82, pulse 72, temperature 98.1 F (36.7 C), resp. rate 18, weight 80.1 kg, last menstrual period 02/10/2018. General appearance: alert, cooperative and appears stated age, breathing through contractions Lungs: regular rate and effort Heart: regular rate  Abdomen: soft, non-tender Extremities: Homans sign is negative, no sign of DVT Presentation: cephalic, confirmed by bedside US Fetal monitoringBaseline: 150 bpm, Variability: Good {> 6 bpm), Accelerations: Reactive and Decelerations: Absent Uterine activity: contractions q2-4 minutes Dilation: 4 Effacement (%): 80 Station: Ballotable Exam by:: jaton burgess rnc   Prenatal labs: ABO, Rh: O/Negative/-- (04/27 1452) Antibody: Negative (08/17  0845) Rubella: 2.51 (04/27 1452) RPR: Non Reactive (08/17 0845)  HBsAg: Negative (04/27 1452)  HIV: Non Reactive (08/17 0845)  GBS: Negative/-- (10/05 1150)  2 hr GTT 85/143/128  Prenatal Transfer Tool  Maternal Diabetes: No Genetic Screening: Declined Maternal Ultrasounds/Referrals: Normal Fetal Ultrasounds or other Referrals:  None Maternal Substance Abuse:  No Significant Maternal Medications:  None Significant Maternal Lab Results: Group B Strep negative  No results found for this or any previous visit (from the past 24 hour(s)).  Patient Active Problem List   Diagnosis Date Noted  . Rh negative status during pregnancy 11/24/2018  . Supervision of normal pregnancy 05/24/2018    Assessment: Jacqueline Johnson is a 23 y.o. G2P1001 at [redacted]w[redacted]d here for SOL  1. Labor: expectant management, augmentation with pitocin if needed 2. FWB: Cat 1 tracing, desires inpatient circ 3. Pain: epidural as desired, IV pain meds if desired 4. GBS: negative   Plan: Anticipate NSVD  Wendy Hoback L Renita Brocks, DO  11/29/2018, 9:19 AM

## 2018-11-29 NOTE — MAU Note (Signed)
Woke up to bright red blood running down her leg around 0415.  CTX started afterwards, approximately every 3-4 minutes.  Reports no complications w/ her pregnancy.  Last intercourse 2 days ago.  + FM.

## 2018-11-29 NOTE — Discharge Summary (Signed)
Postpartum Discharge Summary  Date of Service updated 12/01/18     Patient Name: Jacqueline Johnson DOB: [birth year] MRN: [id number]  Date of admission: 11/29/2018 Delivering Provider: Merilyn Baba   Date of discharge: 12/01/2018  Admitting diagnosis:  39 WKS, CTX Intrauterine pregnancy: [redacted]w[redacted]d    Secondary diagnosis:  Active Problems:   Post term pregnancy at [redacted] weeks gestation   Normal labor  Additional problems: N/A     Discharge diagnosis: Term Pregnancy Delivered                                                                                                Post partum procedures:rhogam  Augmentation: Pitocin  Complications: None  Hospital course:  Onset of Labor With Vaginal Delivery     23y.o. yo G2P1001 at 418w3das admitted in Active Labor on 11/29/2018. Patient had an uncomplicated labor course as follows:   Patient came in contracting and making cervical change spontaneously. She was admitted to labor and delivery and SROMed at 1145 hours with moderate meconium-stained fluid. She progressed to complete with pitocin augmentation.  Membrane Rupture Time/Date: 11:45 AM ,11/29/2018   Intrapartum Procedures: Episiotomy: None [1]                                         Lacerations:  None [1]  Patient had a delivery of a Viable infant. 11/29/2018  Information for the patient's newborn:  ReTaimi, Towe0[[id number]]Delivery Method: Vag-Spont     Patient had an uncomplicated postpartum course.  She is ambulating, tolerating a regular diet, passing flatus, and urinating well. Patient is discharged home in stable condition on 12/01/18.  Delivery time: 3:34 PM    Magnesium Sulfate received: No BMZ received: No Rhophylac:Yes 11/30/18 MMR:No Transfusion:No  Physical exam  Vitals:   11/30/18 0557 11/30/18 1534 11/30/18 2030 12/01/18 0500  BP: 123/76 110/71 106/66 120/79  Pulse: 70 61 77 80  Resp: _0 Temp: 98 F (36.7 C) 98 F (36.7 C) 98.9 F (37.2 C) 98.4  F (36.9 C)  TempSrc:  Oral Oral Oral  SpO2:  100%    Weight:       General: alert, cooperative and no distress Lochia: appropriate Uterine Fundus: firm Incision: N/A DVT Evaluation: No evidence of DVT seen on physical exam. Negative Homan's sign. No cords or calf tenderness. No significant calf/ankle edema. Labs: Lab Results  Component Value Date   WBC 7.6 11/29/2018   HGB 11.7 (L) 11/29/2018   HCT 34.2 (L) 11/29/2018   MCV 89.5 11/29/2018   PLT 166 11/29/2018   CMP Latest Ref Rng & Units 11/06/2015  Glucose 65 - 99 mg/dL 75  BUN 6 - 20 mg/dL 5(L)  Creatinine 0.44 - 1.00 mg/dL 0.60  Sodium 135 - 145 mmol/L 134(L)  Potassium 3.5 - 5.1 mmol/L 3.8  Chloride 101 - 111 mmol/L 107  CO2 22 - 32 mmol/L 16(L)  Calcium 8.9 - 10.3 mg/dL 9.2  Total Protein  6.5 - 8.1 g/dL 7.3  Total Bilirubin 0.3 - 1.2 mg/dL 1.1  Alkaline Phos 38 - 126 U/L 47  AST 15 - 41 U/L 29  ALT 14 - 54 U/L 22    Discharge instruction: per After Visit Summary and "Baby and Me Booklet".  After visit meds:  Allergies as of 12/01/2018      Reactions   Icy Hot Rash   Ingredient in icy hot cause rash      Medication List    STOP taking these medications   famotidine 10 MG tablet Commonly known as: PEPCID   PRILOSEC PO     TAKE these medications   ibuprofen 600 MG tablet Commonly known as: ADVIL Take 1 tablet (600 mg total) by mouth every 6 (six) hours as needed for mild pain, moderate pain or cramping.   PRENATAL VITAMIN PO Take by mouth daily.       Diet: routine diet  Activity: Advance as tolerated. Pelvic rest for 6 weeks.   Outpatient follow up:4 weeks Follow up Appt: Future Appointments  Date Time Provider [place]  12/02/2018  8:10 AM [facility] 1 [facility] None  01/11/2019  1:50 PM Roma Schanz, CNM [facility]   Follow up Visit: Follow-up Information    [facility]. Schedule an appointment as soon as possible for a visit.   Specialty: Obstetrics and  Gynecology Why: in 4-6wks for your postpartum visit Contact information: [address] [place] [phone]         Message sent to clinic on 11/29/18  Please schedule this patient for Postpartum visit in: 4 weeks with the following provider: Any provider For C/S patients schedule nurse incision check in weeks 2 weeks: no Low risk pregnancy complicated by: N/A Delivery mode:  SVD Anticipated Birth Control:  POPs PP Procedures needed: N/A  Schedule Integrated [place] visit: no   Newborn Data: Live born female  Birth Weight:  8lb11.7oz APGAR: 9, 9  Newborn Delivery   Birth date/time: 11/29/2018 15:34:00 Delivery type: Vaginal, Spontaneous      Baby Feeding: Breast Disposition:home with mother  Roma Schanz, CNM, [facility] 12/01/2018 7:40 AM

## 2018-11-29 NOTE — Progress Notes (Signed)
Jacqueline Johnson is a 23 y.o. G2P1001 at [redacted]w[redacted]d admitted for active labor  Subjective: Comfortable with epidural. SROMed at 1145 with moderate meconium-stained fluid.  Objective: BP 96/78   Pulse 67   Temp 97.8 F (36.6 C) (Oral)   Resp 16   Wt 80.1 kg   LMP 02/10/2018   BMI 29.84 kg/m  No intake/output data recorded.  FHT:  FHR: 135 bpm, variability: moderate,  accelerations:  Present,  decelerations:  Absent UC:   regular, every 2-3 minutes  SVE:   Dilation: 7 Effacement (%): 70 Station: -1 Exam by:: Suellyn Meenan  Labs: Lab Results  Component Value Date   WBC 7.6 11/29/2018   HGB 11.7 (L) 11/29/2018   HCT 34.2 (L) 11/29/2018   MCV 89.5 11/29/2018   PLT 166 11/29/2018    Assessment / Plan: 23 yo G2P1001 at 40.3 EGA with SOL  Labor: no cervical change for at least 2 hours after SROM. Augmentation with low dose pitocin started. Consider IUPC placement at next check. Fetal Wellbeing:  Category II Pain Control:  Epidural I/D:  Negative Anticipated MOD:  vaginal delviery  Dan Europe Nico Rogness DO OB Fellow, [facility] 11/29/2018, 1:59 PM

## 2018-11-29 NOTE — Anesthesia Preprocedure Evaluation (Signed)

## 2018-11-30 ENCOUNTER — Encounter (HOSPITAL_COMMUNITY): Payer: Self-pay

## 2018-11-30 LAB — RPR: RPR Ser Ql: NONREACTIVE

## 2018-11-30 MED ORDER — RHO D IMMUNE GLOBULIN 1500 UNIT/2ML IJ SOSY
300.0000 ug | PREFILLED_SYRINGE | Freq: Once | INTRAMUSCULAR | Status: AC
  Administered 2018-11-30: 09:00:00 300 ug via INTRAVENOUS
  Filled 2018-11-30: qty 2

## 2018-11-30 NOTE — Lactation Note (Signed)
This note was copied from a baby's chart. Lactation Consultation Note  Patient Name: Jacqueline Johnson Date: 11/30/2018 Reason for consult: Follow-up assessment;Term;Infant weight loss;Other (Comment)(DAT (+))  22 hours old FT female who is being exclusively BF by his mother, she's a P2. Mom already nursing baby when entering the room, but noticed that the latch was somehow shallow and baby's arm was tucked underneath his body. Offered assistance with latch and mom agreed to reposition baby.   LC took baby STS to mother's right breast in cross cradle position and this time baby was able to get a much deeper latch with audible swallows noted upon breast compressions. Baby still nursing when exiting the room at the 25 minutes mark.  Parents very pleased, mom said this feeding felt a lot better than previous feedings. They're aware of baby's DAT (+) status, and mom is already pumping and supplementing baby her EBM with a spoon, praised her for her efforts. Reviewed normal newborn behavior, feeding cues, cluster feeding and newborn jaundice.  Feeding plan:  1. Encouraged mom to feed baby STS 8-12 times/24 hours or sooner if feeding cues are present 2. She'll continue pumping, ideally every 3 hours and will continue offering any amount of EBM she may get  Parents reported all questions and concerns were answered, they're both aware of [place] OP services and will call PRN.   Maternal Data    Feeding Feeding Type: Breast Fed  LATCH Score Latch: Grasps breast easily, tongue down, lips flanged, rhythmical sucking.  Audible Swallowing: A few with stimulation  Type of Nipple: Everted at rest and after stimulation  Comfort (Breast/Nipple): Soft / non-tender  Hold (Positioning): Assistance needed to correctly position infant at breast and maintain latch.  LATCH Score: 8  Interventions Interventions: Breast feeding basics reviewed;Assisted with latch;Skin to skin;Breast  compression;DEBP  Lactation Tools Discussed/Used Tools: Pump Breast pump type: Double-Electric Breast Pump   Consult Status Consult Status: Follow-up Date: 12/01/18 Follow-up type: In-patient    Jacqueline Johnson Jacqueline Johnson 11/30/2018, 7:00 PM

## 2018-11-30 NOTE — Lactation Note (Signed)
This note was copied from a baby's chart. Lactation Consultation Note  Patient Name: Jacqueline Johnson PZWCH'E Date: 11/30/2018 Reason for consult: Initial assessment;Term P2, 13 hour female infant, DAT+ and bruising in face.  Mom has [name] DEBP at home. Infant had 4 stools and one void, Nurse change a stool while LC in room. Per mom, her 77/24 year old daughter did not latch well would not sustain her latch so she mostly pumped with her. Per dad, infant had large milky emesis at 1 am. Per mom, she last breastfeed infant at 1am (been 41/2 hours since mom last breastfed). Mom latched infant on right breast using the cross cradle hold, LC asked mom to wait until infant's mouth is wide, tongue  and chin downward, bring infant to breast chin first , nose and chin should touch breast. Infant was still breastfeeding after 15 minutes when [name] left room. LC discussed with mom infant should be breastfeed according hunger cues (hunger cues sheet given). Mom knows to breastfed infant 75 to 12 times within 24 hours and on demand. LC gave mom DEBP and mom knows to pump every 3 hours for 15 minutes to given infant back volume. Mom will breastfeed infant then pump and give infant back any EBM. Mom knows to call nurse or [place] if she has any questions, concerns or need assistance with latching infant to breast.  Mom made aware of O/P services, breastfeeding support groups, community resources, and our phone # for post-discharge questions.     Maternal Data Formula Feeding for Exclusion: No Has patient been taught Hand Expression?: Yes(Infant given 1 ml of colostrum by spoon.) Does the patient have breastfeeding experience prior to this delivery?: Yes  Feeding Feeding Type: Breast Fed  LATCH Score Latch: Grasps breast easily, tongue down, lips flanged, rhythmical sucking.  Audible Swallowing: Spontaneous and intermittent  Type of Nipple: Everted at rest and after stimulation  Comfort (Breast/Nipple): Soft  / non-tender  Hold (Positioning): Assistance needed to correctly position infant at breast and maintain latch.  LATCH Score: 9  Interventions Interventions: Breast feeding basics reviewed;Breast compression;Assisted with latch;Adjust position;Skin to skin;Support pillows;DEBP;Position options;Breast massage;Hand express;Expressed milk  Lactation Tools Discussed/Used Tools: Pump Breast pump type: Double-Electric Breast Pump WIC Program: No Pump Review: Setup, frequency, and cleaning;Milk Storage Initiated by:: [name], IBCLC Date initiated:: 11/30/18   Consult Status Consult Status: Follow-up Date: 12/01/18 Follow-up type: In-patient    [name] 11/30/2018, 5:54 AM

## 2018-11-30 NOTE — Lactation Note (Signed)
This note was copied from a baby's chart. Lactation Consultation Note  Patient Name: Jacqueline Johnson OOILN'Z Date: 11/30/2018  P2, 73 hour female infant with facial bruising  and DAT+. LC entered the room mom and infant asleep at this time.     Maternal Data    Feeding    LATCH Score                   Interventions    Lactation Tools Discussed/Used     Consult Status      Jacqueline Johnson 11/30/2018, 2:36 AM

## 2018-11-30 NOTE — Anesthesia Postprocedure Evaluation (Signed)
Anesthesia Post Note  Patient: Jacqueline Johnson  Procedure(s) Performed: AN AD HOC LABOR EPIDURAL     Patient location during evaluation: Mother Baby Anesthesia Type: Epidural Level of consciousness: awake and alert and oriented Pain management: satisfactory to patient Vital Signs Assessment: post-procedure vital signs reviewed and stable Respiratory status: spontaneous breathing and nonlabored ventilation Cardiovascular status: stable Postop Assessment: no headache, no backache, no signs of nausea or vomiting, adequate PO intake, patient able to bend at knees and able to ambulate (patient up walking) Anesthetic complications: no    Last Vitals:  Vitals:   11/30/18 0225 11/30/18 0557  BP: 108/69 123/76  Pulse:  70  Resp: 16 18  Temp: 36.7 C 36.7 C  SpO2:      Last Pain:  Vitals:   11/30/18 0804  TempSrc:   PainSc: 0-No pain   Pain Goal:                   Willa Rough

## 2018-11-30 NOTE — Progress Notes (Signed)
Post Partum Day 1 Subjective: no complaints, up ad lib, voiding and tolerating PO, small lochia, plans to breastfeed,plans condoms/NFP  Objective: Blood pressure 123/76, pulse 70, temperature 98 F (36.7 C), resp. rate 18, weight 80.1 kg, last menstrual period 02/10/2018, SpO2 98 %, unknown if currently breastfeeding.  Physical Exam:  General: alert, cooperative and no distress Lochia:normal flow Chest: CTAB Heart: RRR no m/r/g Abdomen: +BS, soft, nontender,  Uterine Fundus: firm DVT Evaluation: No evidence of DVT seen on physical exam. Extremities: trace edema  Recent Labs    11/29/18 0935  HGB 11.7*  HCT 34.2*    Assessment/Plan: Plan for discharge tomorrow   LOS: 1 day   [name] 11/30/2018, 7:26 AM

## 2018-12-01 LAB — RH IG WORKUP (INCLUDES ABO/RH)
ABO/RH(D): O NEG
Fetal Screen: NEGATIVE
Gestational Age(Wks): 40.3
Unit division: 0

## 2018-12-01 MED ORDER — IBUPROFEN 600 MG PO TABS: 600.0000 mg | ORAL_TABLET | Freq: Four times a day (QID) | ORAL | 0 refills | Status: DC | PRN

## 2018-12-01 NOTE — Lactation Note (Signed)
This note was copied from a baby's chart. Lactation Consultation Note  Patient Name: Jacqueline Johnson DPOEU'M Date: 12/01/2018 Reason for consult: Follow-up assessment;Term  P2 mother whose infant is now 32 hours old.  This is a term baby with a 6% weight loss.  Mother attempted to breast feed her first child but baby would not sustain latch and mother "gave up."  She really wants to breast feed this baby.  Mother had been breast feeding for approximately 15 minutes prior to my arrival.  She had baby latched in the cradle hold on the right breast when I arrived.  He appeared to be nearing completion with his feeding and was very sleepy.  Discussed using caution with the cradle hold and to be sure the latch was deep.  Mother verbalized understanding.    Engorgement prevention/treatment reviewed.  Mother has a manual pump and a DEBP for home use.  She has our OP phone number for questions after discharge.  Reminded her of our virtual support group and breast feeding group as well as our OP [place] visits.  Father present.   Maternal Data Formula Feeding for Exclusion: No Has patient been taught Hand Expression?: Yes  Feeding Feeding Type: Breast Fed  LATCH Score Latch: Grasps breast easily, tongue down, lips flanged, rhythmical sucking.  Audible Swallowing: A few with stimulation  Type of Nipple: Everted at rest and after stimulation  Comfort (Breast/Nipple): Soft / non-tender  Hold (Positioning): No assistance needed to correctly position infant at breast.  LATCH Score: 9  Interventions Interventions: Breast feeding basics reviewed;Skin to skin;Breast compression;Support pillows  Lactation Tools Discussed/Used     Consult Status Consult Status: Complete Date: 12/01/18 Follow-up type: Call as needed    Jacqueline Johnson 12/01/2018, 7:57 AM

## 2018-12-01 NOTE — Progress Notes (Signed)
Post Partum Day 2 Subjective: no complaints, up ad lib, voiding, tolerating PO and + flatus  Objective: Blood pressure 120/79, pulse 80, temperature 98.4 F (36.9 C), temperature source Oral, resp. rate 15, weight 80.1 kg, last menstrual period 02/10/2018, SpO2 100 %, unknown if currently breastfeeding.  Physical Exam:  General: alert and no distress Lochia: appropriate Uterine Fundus: firm DVT Evaluation: No cords or calf tenderness. No significant calf/ankle edema.  Recent Labs    11/29/18 0935  HGB 11.7*  HCT 34.2*    Assessment/Plan: Discharge home, Breastfeeding and Contraception condoms   LOS: 2 days   Dorothyann Peng 12/01/2018, 7:51 AM

## 2018-12-01 NOTE — Discharge Instructions (Signed)
NO SEX UNTIL AFTER YOU GET YOUR BIRTH CONTROL    Postpartum Care After Vaginal Delivery This sheet gives you information about how to care for yourself from the time you deliver your baby to up to 6-12 weeks after delivery (postpartum period). Your health care provider may also give you more specific instructions. If you have problems or questions, contact your health care provider. Follow these instructions at home: Vaginal bleeding  It is normal to have vaginal bleeding (lochia) after delivery. Wear a sanitary pad for vaginal bleeding and discharge. ? During the first week after delivery, the amount and appearance of lochia is often similar to a menstrual period. ? Over the next few weeks, it will gradually decrease to a dry, yellow-brown discharge. ? For most women, lochia stops completely by 4-6 weeks after delivery. Vaginal bleeding can vary from woman to woman.  Change your sanitary pads frequently. Watch for any changes in your flow, such as: ? A sudden increase in volume. ? A change in color. ? Large blood clots.  If you pass a blood clot from your vagina, save it and call your health care provider to discuss. Do not flush blood clots down the toilet before talking with your health care provider.  Do not use tampons or douches until your health care provider says this is safe.  If you are not breastfeeding, your period should return 6-8 weeks after delivery. If you are feeding your child breast milk only (exclusive breastfeeding), your period may not return until you stop breastfeeding. Perineal care  Keep the area between the vagina and the anus (perineum) clean and dry as told by your health care provider. Use medicated pads and pain-relieving sprays and creams as directed.  If you had a cut in the perineum (episiotomy) or a tear in the vagina, check the area for signs of infection until you are healed. Check for: ? More redness, swelling, or pain. ? Fluid or blood coming from  the cut or tear. ? Warmth. ? Pus or a bad smell.  You may be given a squirt bottle to use instead of wiping to clean the perineum area after you go to the bathroom. As you start healing, you may use the squirt bottle before wiping yourself. Make sure to wipe gently.  To relieve pain caused by an episiotomy, a tear in the vagina, or swollen veins in the anus (hemorrhoids), try taking a warm sitz bath 2-3 times a day. A sitz bath is a warm water bath that is taken while you are sitting down. The water should only come up to your hips and should cover your buttocks. Breast care  Within the first few days after delivery, your breasts may feel heavy, full, and uncomfortable (breast engorgement). Milk may also leak from your breasts. Your health care provider can suggest ways to help relieve the discomfort. Breast engorgement should go away within a few days.  If you are breastfeeding: ? Wear a bra that supports your breasts and fits you well. ? Keep your nipples clean and dry. Apply creams and ointments as told by your health care provider. ? You may need to use breast pads to absorb milk that leaks from your breasts. ? You may have uterine contractions every time you breastfeed for up to several weeks after delivery. Uterine contractions help your uterus return to its normal size. ? If you have any problems with breastfeeding, work with your health care provider or Science writer.  If you are not  Avoid touching your breasts a lot. Doing this can make your breasts produce more milk. °? Wear a good-fitting bra and use cold packs to help with swelling. °? Do not squeeze out (express) milk. This causes you to make more milk. °Intimacy and sexuality °· Ask your health care provider when you can engage in sexual activity. This may depend on: °? Your risk of infection. °? How fast you are healing. °? Your comfort and desire to engage in sexual activity. °· You are able to get pregnant  after delivery, even if you have not had your period. If desired, talk with your health care provider about methods of birth control (contraception). °Medicines °· Take over-the-counter and prescription medicines only as told by your health care provider. °· If you were prescribed an antibiotic medicine, take it as told by your health care provider. Do not stop taking the antibiotic even if you start to feel better. °Activity °· Gradually return to your normal activities as told by your health care provider. Ask your health care provider what activities are safe for you. °· Rest as much as possible. Try to rest or take a nap while your baby is sleeping. °Eating and drinking ° °· Drink enough fluid to keep your urine pale yellow. °· Eat high-fiber foods every day. These may help prevent or relieve constipation. High-fiber foods include: °? Whole grain cereals and breads. °? Brown rice. °? Beans. °? Fresh fruits and vegetables. °· Do not try to lose weight quickly by cutting back on calories. °· Take your prenatal vitamins until your postpartum checkup or until your health care provider tells you it is okay to stop. °Lifestyle °· Do not use any products that contain nicotine or tobacco, such as cigarettes and e-cigarettes. If you need help quitting, ask your health care provider. °· Do not drink alcohol, especially if you are breastfeeding. °General instructions °· Keep all follow-up visits for you and your baby as told by your health care provider. Most women visit their health care provider for a postpartum checkup within the first 3-6 weeks after delivery. °Contact a health care provider if: °· You feel unable to cope with the changes that your child brings to your life, and these feelings do not go away. °· You feel unusually sad or worried. °· Your breasts become red, painful, or hard. °· You have a fever. °· You have trouble holding urine or keeping urine from leaking. °· You have little or no interest in  activities you used to enjoy. °· You have not breastfed at all and you have not had a menstrual period for 12 weeks after delivery. °· You have stopped breastfeeding and you have not had a menstrual period for 12 weeks after you stopped breastfeeding. °· You have questions about caring for yourself or your baby. °· You pass a blood clot from your vagina. °Get help right away if: °· You have chest pain. °· You have difficulty breathing. °· You have sudden, severe leg pain. °· You have severe pain or cramping in your lower abdomen. °· You bleed from your vagina so much that you fill more than one sanitary pad in one hour. Bleeding should not be heavier than your heaviest period. °· You develop a severe headache. °· You faint. °· You have blurred vision or spots in your vision. °· You have bad-smelling vaginal discharge. °· You have thoughts about hurting yourself or your baby. °If you ever feel like you may hurt yourself or others,   yourself or others, or have thoughts about taking your own life, get help right away. You can go to the nearest emergency department or call:  Your local emergency services (911 in the [place]  A suicide crisis helpline, such as the National Suicide Prevention Lifeline at 782-359-37381-913-688-7104. This is open 24 hours a day. Summary  The period of time right after you deliver your newborn up to 6-12 weeks after delivery is called the postpartum period.  Gradually return to your normal activities as told by your health care provider.  Keep all follow-up visits for you and your baby as told by your health care provider. This information is not intended to replace advice given to you by your health care provider. Make sure you discuss any questions you have with your health care provider. Document Released: 11/10/2006 Document Revised: 10/27/2016 Document Reviewed: 10/27/2016 Elsevier Interactive Patient Education  2019 [employer]   Breastfeeding   Choosing to breastfeed is one of the best decisions  you can make for yourself and your baby. A change in hormones during pregnancy causes your breasts to make breast milk in your milk-producing glands. Hormones prevent breast milk from being released before your baby is born. They also prompt milk flow after birth. Once breastfeeding has begun, thoughts of your baby, as well as his or her sucking or crying, can stimulate the release of milk from your milk-producing glands. Benefits of breastfeeding Research shows that breastfeeding offers many health benefits for infants and mothers. It also offers a cost-free and convenient way to feed your baby. For your baby  Your first milk (colostrum) helps your baby's digestive system to function better.  Special cells in your milk (antibodies) help your baby to fight off infections.  Breastfed babies are less likely to develop asthma, allergies, obesity, or type 2 diabetes. They are also at lower risk for sudden infant death syndrome (SIDS).  Nutrients in breast milk are better able to meet your babys needs compared to infant formula.  Breast milk improves your baby's brain development. For you  Breastfeeding helps to create a very special bond between you and your baby.  Breastfeeding is convenient. Breast milk costs nothing and is always available at the correct temperature.  Breastfeeding helps to burn calories. It helps you to lose the weight that you gained during pregnancy.  Breastfeeding makes your uterus return faster to its size before pregnancy. It also slows bleeding (lochia) after you give birth.  Breastfeeding helps to lower your risk of developing type 2 diabetes, osteoporosis, rheumatoid arthritis, cardiovascular disease, and breast, ovarian, uterine, and endometrial cancer later in life. Breastfeeding basics Starting breastfeeding  Find a comfortable place to sit or lie down, with your neck and back well-supported.  Place a pillow or a rolled-up blanket under your baby to bring  him or her to the level of your breast (if you are seated). Nursing pillows are specially designed to help support your arms and your baby while you breastfeed.  Make sure that your baby's tummy (abdomen) is facing your abdomen.  Gently massage your breast. With your fingertips, massage from the outer edges of your breast inward toward the nipple. This encourages milk flow. If your milk flows slowly, you may need to continue this action during the feeding.  Support your breast with 4 fingers underneath and your thumb above your nipple (make the letter "C" with your hand). Make sure your fingers are well away from your nipple and your babys mouth.  Stroke your  your finger or nipple. °· When your baby's mouth is open wide enough, quickly bring your baby to your breast, placing your entire nipple and as much of the areola as possible into your baby's mouth. The areola is the colored area around your nipple. °? More areola should be visible above your baby's upper lip than below the lower lip. °? Your baby's lips should be opened and extended outward (flanged) to ensure an adequate, comfortable latch. °? Your baby's tongue should be between his or her lower gum and your breast. °· Make sure that your baby's mouth is correctly positioned around your nipple (latched). Your baby's lips should create a seal on your breast and be turned out (everted). °· It is common for your baby to suck about 2-3 minutes in order to start the flow of breast milk. °Latching °Teaching your baby how to latch onto your breast properly is very important. An improper latch can cause nipple pain, decreased milk supply, and poor weight gain in your baby. Also, if your baby is not latched onto your nipple properly, he or she may swallow some air during feeding. This can make your baby fussy. Burping your baby when you switch breasts during the feeding can help to get rid of the air. However, teaching your baby to  latch on properly is still the best way to prevent fussiness from swallowing air while breastfeeding. °Signs that your baby has successfully latched onto your nipple °· Silent tugging or silent sucking, without causing you pain. Infant's lips should be extended outward (flanged). °· Swallowing heard between every 3-4 sucks once your milk has started to flow (after your let-down milk reflex occurs). °· Muscle movement above and in front of his or her ears while sucking. °Signs that your baby has not successfully latched onto your nipple °· Sucking sounds or smacking sounds from your baby while breastfeeding. °· Nipple pain. °If you think your baby has not latched on correctly, slip your finger into the corner of your baby’s mouth to break the suction and place it between your baby's gums. Attempt to start breastfeeding again. °Signs of successful breastfeeding °Signs from your baby °· Your baby will gradually decrease the number of sucks or will completely stop sucking. °· Your baby will fall asleep. °· Your baby's body will relax. °· Your baby will retain a small amount of milk in his or her mouth. °· Your baby will let go of your breast by himself or herself. °Signs from you °· Breasts that have increased in firmness, weight, and size 1-3 hours after feeding. °· Breasts that are softer immediately after breastfeeding. °· Increased milk volume, as well as a change in milk consistency and color by the fifth day of breastfeeding. °· Nipples that are not sore, cracked, or bleeding. °Signs that your baby is getting enough milk °· Wetting at least 1-2 diapers during the first 24 hours after birth. °· Wetting at least 5-6 diapers every 24 hours for the first week after birth. The urine should be clear or pale yellow by the age of 5 days. °· Wetting 6-8 diapers every 24 hours as your baby continues to grow and develop. °· At least 3 stools in a 24-hour period by the age of 5 days. The stool should be soft and yellow. °· At  least 3 stools in a 24-hour period by the age of 7 days. The stool should be seedy and yellow. °· No loss of weight greater than 10% of birth weight   10% of birth weight during the first 3 days of life.  Average weight gain of 4-7 oz (113-198 g) per week after the age of 4 days.  Consistent daily weight gain by the age of 5 days, without weight loss after the age of 2 weeks. After a feeding, your baby may spit up a small amount of milk. This is normal. Breastfeeding frequency and duration Frequent feeding will help you make more milk and can prevent sore nipples and extremely full breasts (breast engorgement). Breastfeed when you feel the need to reduce the fullness of your breasts or when your baby shows signs of hunger. This is called "breastfeeding on demand." Signs that your baby is hungry include:  Increased alertness, activity, or restlessness.  Movement of the head from side to side.  Opening of the mouth when the corner of the mouth or cheek is stroked (rooting).  Increased sucking sounds, smacking lips, cooing, sighing, or squeaking.  Hand-to-mouth movements and sucking on fingers or hands.  Fussing or crying. Avoid introducing a pacifier to your baby in the first 4-6 weeks after your baby is born. After this time, you may choose to use a pacifier. Research has shown that pacifier use during the first year of a baby's life decreases the risk of sudden infant death syndrome (SIDS). Allow your baby to feed on each breast as long as he or she wants. When your baby unlatches or falls asleep while feeding from the first breast, offer the second breast. Because newborns are often sleepy in the first few weeks of life, you may need to awaken your baby to get him or her to feed. Breastfeeding times will vary from baby to baby. However, the following rules can serve as a guide to help you make sure that your baby is properly fed:  Newborns (babies 31 weeks of age or younger) may breastfeed every 1-3  hours.  Newborns should not go without breastfeeding for longer than 3 hours during the day or 5 hours during the night.  You should breastfeed your baby a minimum of 8 times in a 24-hour period. Breast milk pumping       Pumping and storing breast milk allows you to make sure that your baby is exclusively fed your breast milk, even at times when you are unable to breastfeed. This is especially important if you go back to work while you are still breastfeeding, or if you are not able to be present during feedings. Your lactation consultant can help you find a method of pumping that works best for you and give you guidelines about how long it is safe to store breast milk. Caring for your breasts while you breastfeed Nipples can become dry, cracked, and sore while breastfeeding. The following recommendations can help keep your breasts moisturized and healthy:  Avoid using soap on your nipples.  Wear a supportive bra designed especially for nursing. Avoid wearing underwire-style bras or extremely tight bras (sports bras).  Air-dry your nipples for 3-4 minutes after each feeding.  Use only cotton bra pads to absorb leaked breast milk. Leaking of breast milk between feedings is normal.  Use lanolin on your nipples after breastfeeding. Lanolin helps to maintain your skin's normal moisture barrier. Pure lanolin is not harmful (not toxic) to your baby. You may also hand express a few drops of breast milk and gently massage that milk into your nipples and allow the milk to air-dry. In the first few weeks after giving birth, some women experience  breasts feel heavy, warm, and tender to the touch. Engorgement peaks within 3-5 days after you give birth. The following recommendations can help to ease engorgement: °· Completely empty your breasts while breastfeeding or pumping. You may want to start by applying warm, moist heat (in the shower or with warm,  water-soaked hand towels) just before feeding or pumping. This increases circulation and helps the milk flow. If your baby does not completely empty your breasts while breastfeeding, pump any extra milk after he or she is finished. °· Apply ice packs to your breasts immediately after breastfeeding or pumping, unless this is too uncomfortable for you. To do this: °? Put ice in a plastic bag. °? Place a towel between your skin and the bag. °? Leave the ice on for 20 minutes, 2-3 times a day. °· Make sure that your baby is latched on and positioned properly while breastfeeding. °If engorgement persists after 48 hours of following these recommendations, contact your health care provider or a lactation consultant. °Overall health care recommendations while breastfeeding °· Eat 3 healthy meals and 3 snacks every day. Well-nourished mothers who are breastfeeding need an additional 450-500 calories a day. You can meet this requirement by increasing the amount of a balanced diet that you eat. °· Drink enough water to keep your urine pale yellow or clear. °· Rest often, relax, and continue to take your prenatal vitamins to prevent fatigue, stress, and low vitamin and mineral levels in your body (nutrient deficiencies). °· Do not use any products that contain nicotine or tobacco, such as cigarettes and e-cigarettes. Your baby may be harmed by chemicals from cigarettes that pass into breast milk and exposure to secondhand smoke. If you need help quitting, ask your health care provider. °· Avoid alcohol. °· Do not use illegal drugs or marijuana. °· Talk with your health care provider before taking any medicines. These include over-the-counter and prescription medicines as well as vitamins and herbal supplements. Some medicines that may be harmful to your baby can pass through breast milk. °· It is possible to become pregnant while breastfeeding. If birth control is desired, ask your health care provider about options that will  be safe while breastfeeding your baby. °Where to find more information: °[employer]: www.llli.org °Contact a health care provider if: °· You feel like you want to stop breastfeeding or have become frustrated with breastfeeding. °· Your nipples are cracked or bleeding. °· Your breasts are red, tender, or warm. °· You have: °? Painful breasts or nipples. °? A swollen area on either breast. °? A fever or chills. °? Nausea or vomiting. °? Drainage other than breast milk from your nipples. °· Your breasts do not become full before feedings by the fifth day after you give birth. °· You feel sad and depressed. °· Your baby is: °? Too sleepy to eat well. °? Having trouble sleeping. °? More than 1 week old and wetting fewer than 6 diapers in a 24-hour period. °? Not gaining weight by 5 days of age. °· Your baby has fewer than 3 stools in a 24-hour period. °· Your baby's skin or the white parts of his or her eyes become yellow. °Get help right away if: °· Your baby is overly tired (lethargic) and does not want to wake up and feed. °· Your baby develops an unexplained fever. °Summary °· Breastfeeding offers many health benefits for infant and mothers. °· Try to breastfeed your infant when he or she shows early signs of   hunger. °· Gently tickle or stroke your baby's lips with your finger or nipple to allow the baby to open his or her mouth. Bring the baby to your breast. Make sure that much of the areola is in your baby's mouth. Offer one side and burp the baby before you offer the other side. °· Talk with your health care provider or lactation consultant if you have questions or you face problems as you breastfeed. °This information is not intended to replace advice given to you by your health care provider. Make sure you discuss any questions you have with your health care provider. °Document Released: 01/13/2005 Document Revised: 02/15/2016 Document Reviewed: 02/15/2016 °Elsevier Interactive Patient  Education © 2019 [employer] ° ° °

## 2018-12-02 ENCOUNTER — Other Ambulatory Visit (HOSPITAL_COMMUNITY)
Admission: RE | Admit: 2018-12-02 | Discharge: 2018-12-02 | Disposition: A | Discharge status: Home / Self Care | Payer: BC Managed Care – PPO | Source: Ambulatory Visit | Attending: Obstetrics and Gynecology | Admitting: Obstetrics and Gynecology

## 2018-12-02 LAB — TYPE AND SCREEN
ABO/RH(D): O NEG
Antibody Screen: POSITIVE
Unit division: 0
Unit division: 0

## 2018-12-02 LAB — BPAM RBC
Blood Product Expiration Date: 202011202359
Blood Product Expiration Date: 202011212359
Unit Type and Rh: 9500
Unit Type and Rh: 9500

## 2018-12-03 ENCOUNTER — Other Ambulatory Visit: Payer: BC Managed Care – PPO

## 2018-12-03 ENCOUNTER — Encounter: Payer: BC Managed Care – PPO | Admitting: Obstetrics and Gynecology

## 2018-12-04 ENCOUNTER — Inpatient Hospital Stay (HOSPITAL_COMMUNITY): Payer: BC Managed Care – PPO

## 2018-12-04 ENCOUNTER — Inpatient Hospital Stay (HOSPITAL_COMMUNITY)
Admission: AD | Admit: 2018-12-04 | Payer: BC Managed Care – PPO | Source: Home / Self Care | Admitting: Obstetrics and Gynecology

## 2018-12-08 ENCOUNTER — Telehealth: Payer: Self-pay | Admitting: *Deleted

## 2018-12-08 NOTE — Telephone Encounter (Signed)
Called patient back and left vm that I am returning her call.

## 2018-12-08 NOTE — Telephone Encounter (Signed)
Pt left message that she is having severe pain in her lower left pelvic area. Nothing makes it feel better. Would like advice on what to do.

## 2019-01-11 ENCOUNTER — Telehealth (INDEPENDENT_AMBULATORY_CARE_PROVIDER_SITE_OTHER): Payer: BC Managed Care – PPO | Admitting: Women's Health

## 2019-01-11 ENCOUNTER — Encounter: Payer: Self-pay | Admitting: Women's Health

## 2019-01-11 ENCOUNTER — Other Ambulatory Visit: Payer: Self-pay

## 2019-01-11 NOTE — Patient Instructions (Signed)
Constipation  Drink plenty of fluid, preferably water, throughout the day  Eat foods high in fiber such as fruits, vegetables, and grains  Exercise, such as walking, is a good way to keep your bowels regular  Drink warm fluids, especially warm prune juice, or decaf coffee  Eat a 1/2 cup of real oatmeal (not instant), 1/2 cup applesauce, and 1/2-1 cup warm prune juice every day  If needed, you may take Colace (docusate sodium) stool softener once or twice a day to help keep the stool soft.  If you still are having problems with constipation, you may take Miralax once daily as needed to help keep your bowels regular.    

## 2019-01-11 NOTE — Progress Notes (Signed)
TELEHEALTH VIRTUAL POSTPARTUM VISIT ENCOUNTER NOTE Patient name: Jacqueline Johnson MRN [id number]  Date of birth: Jun 10, 1995  I connected with patient on 01/11/19 at  1:50 PM EST by MyChart video  and verified that I am speaking with the correct person using two identifiers. Due to COVID-19 recommendations, pt is not currently in our office.    I discussed the limitations, risks, security and privacy concerns of performing an evaluation and management service by telephone and the availability of in person appointments. I also discussed with the patient that there may be a patient responsible charge related to this service. The patient expressed understanding and agreed to proceed.  Chief Complaint:   Postpartum Care  History of Present Illness:   Jacqueline Johnson is a 23 y.o. G85P2002 Caucasian female being evaluated today for a postpartum visit. She is 6 weeks postpartum following a spontaneous vaginal delivery at 40.3 gestational weeks. Anesthesia: epidural. Laceration: labial. I have fully reviewed the prenatal and intrapartum course. Pregnancy uncomplicated. Postpartum course has been uncomplicated. Bleeding thin lochia. Bowel function is constipation. Bladder function is normal.  Patient is sexually active.  Contraception method is condoms.  Last pap 09/13/18.  Results were normal .  No LMP recorded.  Baby's course has been uncomplicated. Baby is feeding by pumping and feeding in bottle   Edinburgh Postpartum Depression Screening: negative Edinburgh Postnatal Depression Scale - 01/11/19 1359      Edinburgh Postnatal Depression Scale:  In the Past 7 Days   I have been able to laugh and see the funny side of things.  0    I have looked forward with enjoyment to things.  0    I have blamed myself unnecessarily when things went wrong.  0    I have been anxious or worried for no good reason.  1    I have felt scared or panicky for no good reason.  1    Things have been getting on top of me.  0    I  have been so unhappy that I have had difficulty sleeping.  0    I have felt sad or miserable.  0    I have been so unhappy that I have been crying.  0    The thought of harming myself has occurred to me.  0    Edinburgh Postnatal Depression Scale Total  2      Review of Systems:   Pertinent items are noted in HPI Denies Abnormal vaginal discharge w/ itching/odor/irritation, headaches, visual changes, shortness of breath, chest pain, abdominal pain, severe nausea/vomiting, or problems with urination or bowel movements. Pertinent History Reviewed:  Reviewed past medical,surgical, obstetrical and family history.  Reviewed problem list, medications and allergies. OB History  Gravida Para Term Preterm AB Living  2 2 2  0 0 2  SAB TAB Ectopic Multiple Live Births  0 0 0 0 2    # Outcome Date GA Lbr Len/2nd Weight Sex Delivery Anes PTL Lv  2 Term 11/29/18 [redacted]w[redacted]d 11:09 / 00:10 8 lb 11.7 oz (3.96 kg) M Vag-Spont EPI  LIV     Birth Comments: facial bruising  1 Term 05/09/16 [redacted]w[redacted]d  7 lb 1 oz (3.204 kg) F Vag-Spont None N LIV   Physical Assessment:  There were no vitals filed for this visit.There is no height or weight on file to calculate BMI.       Physical Examination:  General:  Alert, oriented and cooperative.   Mental Status: Normal mood and  affect perceived. Normal judgment and thought content.  Rest of physical exam deferred due to type of encounter       No results found for this or any previous visit (from the past 24 hour(s)).  Assessment & Plan:  1) Postpartum exam 2) 6 wks s/p SVB 3) Breastfeeding 4) Depression screening 5) Contraception counseling, pt prefers condoms  Meds: No orders of the defined types were placed in this encounter.   I discussed the assessment and treatment plan with the patient. The patient was provided an opportunity to ask questions and all were answered. The patient agreed with the plan and demonstrated an understanding of the instructions.   The  patient was advised to call back or seek an in-person evaluation/go to the ED for any concerning postpartum symptoms.  I provided 15 minutes of non-face-to-face time during this encounter.  Follow-up: No follow-ups on file.   No orders of the defined types were placed in this encounter.   [place] 01/11/2019 2:08 PM

## 2019-03-22 ENCOUNTER — Ambulatory Visit: Payer: BC Managed Care – PPO | Admitting: Adult Health

## 2020-07-25 ENCOUNTER — Ambulatory Visit (INDEPENDENT_AMBULATORY_CARE_PROVIDER_SITE_OTHER): Payer: Medicaid Other | Admitting: Adult Health

## 2020-07-25 ENCOUNTER — Encounter: Payer: Self-pay | Admitting: Adult Health

## 2020-07-25 ENCOUNTER — Other Ambulatory Visit: Payer: Self-pay

## 2020-07-25 VITALS — BP 111/72 | HR 86 | Ht 64.0 in | Wt 175.0 lb

## 2020-07-25 DIAGNOSIS — N926 Irregular menstruation, unspecified: Secondary | ICD-10-CM

## 2020-07-25 DIAGNOSIS — F419 Anxiety disorder, unspecified: Secondary | ICD-10-CM

## 2020-07-25 DIAGNOSIS — Z01419 Encounter for gynecological examination (general) (routine) without abnormal findings: Secondary | ICD-10-CM | POA: Diagnosis not present

## 2020-07-25 DIAGNOSIS — F439 Reaction to severe stress, unspecified: Secondary | ICD-10-CM | POA: Diagnosis not present

## 2020-07-25 DIAGNOSIS — F32A Depression, unspecified: Secondary | ICD-10-CM

## 2020-07-25 LAB — POCT URINE PREGNANCY: Preg Test, Ur: NEGATIVE

## 2020-07-25 NOTE — Progress Notes (Signed)
Patient ID: Jacqueline Johnson, female   DOB: 1995-05-17, 25 y.o.   MRN: [id number] History of Present Illness:  Jacqueline Johnson is a 25 year old white female, married, G2P2 in for well woman gyn exam. Lab Results  Component Value Date   DIAGPAP Molecular only (A) 11/01/2018   HPV NOT Detected 09/13/2018   PCP is Dr Andrey Campanile.  Current Medications, Allergies, Past Medical History, Past Surgical History, Family History and Social History were reviewed in [employer] record.     Review of Systems: Patient denies any headaches, hearing loss, fatigue, blurred vision, shortness of breath, chest pain, abdominal pain, problems with bowel movements, urination, or intercourse. No joint pain or mood swings.  She has gained weight  +stress in nursing school Has irregular periods, 40-50 day cycles.    Physical Exam:BP 111/72 (BP Location: Right Arm, Patient Position: Sitting, Cuff Size: Normal)   Pulse 86   Ht 5\' 4"  (1.626 m)   Wt 175 lb (79.4 kg)   LMP 06/07/2020   Breastfeeding No   BMI 30.04 kg/m  UPT is negative. General:  Well developed, well nourished, no acute distress Skin:  Warm and dry Neck:  Midline trachea, normal thyroid, good ROM, no lymphadenopathy Lungs; Clear to auscultation bilaterally Breast:  No dominant palpable mass, retraction, or nipple discharge,has white pimple left breast Cardiovascular: Regular rate and rhythm Abdomen:  Soft, non tender, no hepatosplenomegaly Pelvic:  External genitalia is normal in appearance, no lesions.  The vagina is normal in appearance. Urethra has no lesions or masses. The cervix is bulbous.  Uterus is felt to be normal size, shape, and contour.  No adnexal masses or tenderness noted.Bladder is non tender, no masses felt. Rectal: Deferred  Extremities/musculoskeletal:  No swelling or varicosities noted, no clubbing or cyanosis Psych:  No mood changes, alert and cooperative,seems happy AA is 2 Fall risk is low Depression screen [facility] 2/9  07/25/2020 05/31/2018 04/05/2018  Decreased Interest 2 0 0  Down, Depressed, Hopeless 2 0 0  PHQ - 2 Score 4 0 0  Altered sleeping 2 1 -  Tired, decreased energy 2 0 -  Change in appetite 2 0 -  Feeling bad or failure about yourself  2 0 -  Trouble concentrating 2 0 -  Moving slowly or fidgety/restless 1 0 -  Suicidal thoughts 0 0 -  PHQ-9 Score 15 1 -   She is on Cymbalta and just had dose raised  GAD 7 : Generalized Anxiety Score 07/25/2020  Nervous, Anxious, on Edge 2  Control/stop worrying 2  Worry too much - different things 2  Trouble relaxing 2  Restless 2  Easily annoyed or irritable 2  Afraid - awful might happen 1  Total GAD 7 Score 13      Upstream - 07/25/20 1534       Pregnancy Intention Screening   Does the patient want to become pregnant in the next year? No    Does the patient's partner want to become pregnant in the next year? No    Would the patient like to discuss contraceptive options today? No      Contraception Wrap Up   Current Method Female Condom    End Method Female Condom    Contraception Counseling Provided No             Examination chaperoned by 07/27/20 LPN  Impression and Plan: 1. Irregular periods Will follow for now  2. Encounter for well woman exam with routine gynecological  exam Pap and physical in 1 year  3. Stress   4. Anxiety and depression Follow up with PCP, about Cymbalta

## 2021-09-18 ENCOUNTER — Other Ambulatory Visit (HOSPITAL_COMMUNITY): Payer: Self-pay

## 2021-09-18 MED ORDER — OMEPRAZOLE 40 MG PO CPDR
40.0000 mg | DELAYED_RELEASE_CAPSULE | Freq: Every day | ORAL | 3 refills | Status: DC
  Filled 2021-09-18: qty 30, 30d supply, fill #0

## 2021-09-18 MED ORDER — PHENTERMINE HCL 37.5 MG PO TABS
37.5000 mg | ORAL_TABLET | Freq: Every day | ORAL | 2 refills | Status: DC
  Filled 2021-09-18: qty 30, 30d supply, fill #0

## 2021-09-26 ENCOUNTER — Other Ambulatory Visit (HOSPITAL_COMMUNITY): Payer: Self-pay

## 2022-06-11 DIAGNOSIS — L738 Other specified follicular disorders: Secondary | ICD-10-CM | POA: Diagnosis not present

## 2022-06-11 DIAGNOSIS — Z79899 Other long term (current) drug therapy: Secondary | ICD-10-CM | POA: Diagnosis not present

## 2022-06-11 DIAGNOSIS — L700 Acne vulgaris: Secondary | ICD-10-CM | POA: Diagnosis not present

## 2022-06-11 DIAGNOSIS — I788 Other diseases of capillaries: Secondary | ICD-10-CM | POA: Diagnosis not present

## 2022-07-15 DIAGNOSIS — Z79899 Other long term (current) drug therapy: Secondary | ICD-10-CM | POA: Diagnosis not present

## 2022-07-15 DIAGNOSIS — L700 Acne vulgaris: Secondary | ICD-10-CM | POA: Diagnosis not present

## 2022-08-15 DIAGNOSIS — Z79899 Other long term (current) drug therapy: Secondary | ICD-10-CM | POA: Diagnosis not present

## 2022-08-15 DIAGNOSIS — L700 Acne vulgaris: Secondary | ICD-10-CM | POA: Diagnosis not present

## 2022-09-10 NOTE — Progress Notes (Deleted)
New Patient Office Visit  Subjective    Patient ID: Jacqueline Johnson, female    DOB: 1995-10-25  Age: 27 y.o. MRN: [id number]  CC: No chief complaint on file.   HPI Jacqueline Johnson presents to establish care ***  Outpatient Encounter Medications as of 09/11/2022  Medication Sig   DULoxetine (CYMBALTA) 30 MG capsule Take 30 mg by mouth 2 (two) times daily.   omeprazole (PRILOSEC) 40 MG capsule Take 1 capsule (40 mg total) by mouth daily.   phentermine (ADIPEX-P) 37.5 MG tablet Take 1 tablet (37.5 mg total) by mouth daily before breakfast.   No facility-administered encounter medications on file as of 09/11/2022.    Past Medical History:  Diagnosis Date   Anxiety    Depression    Medical history non-contributory    Mental disorder     Past Surgical History:  Procedure Laterality Date   WISDOM TOOTH EXTRACTION      Family History  Problem Relation Age of Onset   Cancer Maternal Grandmother    Other Maternal Grandfather        heart issues   Cancer Maternal Grandfather    Diabetes Father    Kidney Stones Mother    Other Brother        passed away soon after birth   Kidney Stones Sister    Seizures Sister     Social History   Socioeconomic History   Marital status: Married    Spouse name: Not on file   Number of children: 1   Years of education: Not on file   Highest education level: Not on file  Occupational History   Occupation: CNA  Tobacco Use   Smoking status: Never   Smokeless tobacco: Never  Vaping Use   Vaping status: Never Used  Substance and Sexual Activity   Alcohol use: No   Drug use: No   Sexual activity: Yes    Birth control/protection: Condom  Other Topics Concern   Not on file  Social History Narrative   ** Merged History Encounter **       Social Determinants of Health   Financial Resource Strain: Low Risk  (07/25/2020)   Overall Financial Resource Strain (CARDIA)    Difficulty of Paying Living Expenses: Not hard at all  Food Insecurity:  No Food Insecurity (07/25/2020)   Hunger Vital Sign    Worried About Running Out of Food in the Last Year: Never true    Ran Out of Food in the Last Year: Never true  Transportation Needs: No Transportation Needs (07/25/2020)   PRAPARE - Administrator, Civil Service (Medical): No    Lack of Transportation (Non-Medical): No  Physical Activity: Inactive (07/25/2020)   Exercise Vital Sign    Days of Exercise per Week: 0 days    Minutes of Exercise per Session: 0 min  Stress: Stress Concern Present (07/25/2020)   [employer] of Occupational Health - Occupational Stress Questionnaire    Feeling of Stress : Rather much  Social Connections: Moderately Integrated (07/25/2020)   Social Connection and Isolation Panel [NHANES]    Frequency of Communication with Friends and Family: More than three times a week    Frequency of Social Gatherings with Friends and Family: More than three times a week    Attends Religious Services: 1 to 4 times per year    Active Member of [employer] or Organizations: No    Attends Banker Meetings: Never    Marital Status: Married  Intimate Partner Violence: Not At Risk (07/25/2020)   Humiliation, Afraid, Rape, and Kick questionnaire    Fear of Current or Ex-Partner: No    Emotionally Abused: No    Physically Abused: No    Sexually Abused: No    ROS Negative unless indicated in HPI   Objective    There were no vitals taken for this visit.  Physical Exam  Last CBC Lab Results  Component Value Date   WBC 7.6 11/29/2018   HGB 11.7 (L) 11/29/2018   HCT 34.2 (L) 11/29/2018   MCV 89.5 11/29/2018   MCH 30.6 11/29/2018   RDW 12.7 11/29/2018   PLT 166 11/29/2018   Last metabolic panel Lab Results  Component Value Date   GLUCOSE 75 11/06/2015   NA 134 (L) 11/06/2015   K 3.8 11/06/2015   CL 107 11/06/2015   CO2 16 (L) 11/06/2015   BUN 5 (L) 11/06/2015   CREATININE 0.60 11/06/2015   GFRNONAA >60 11/06/2015   CALCIUM 9.2  11/06/2015   PROT 7.3 11/06/2015   ALBUMIN 4.1 11/06/2015   BILITOT 1.1 11/06/2015   ALKPHOS 47 11/06/2015   AST 29 11/06/2015   ALT 22 11/06/2015   ANIONGAP 11 11/06/2015         Assessment & Plan:  Routine medical exam    No follow-ups on file.   Arrie Aran Santa Lighter, DNP [facility] [address] [place], [place] [address] [phone]

## 2022-09-11 ENCOUNTER — Ambulatory Visit: Payer: BLUE CROSS/BLUE SHIELD | Admitting: Nurse Practitioner

## 2022-09-16 DIAGNOSIS — Z79899 Other long term (current) drug therapy: Secondary | ICD-10-CM | POA: Diagnosis not present

## 2022-09-16 DIAGNOSIS — L700 Acne vulgaris: Secondary | ICD-10-CM | POA: Diagnosis not present

## 2022-09-17 NOTE — Progress Notes (Unsigned)
New Patient Office Visit  Subjective    Patient ID: Jacqueline Johnson, female    DOB: 1995-05-03  Age: 27 y.o. MRN: [id number]  CC: No chief complaint on file.   HPI Jacqueline Johnson presents to establish care ***  Outpatient Encounter Medications as of 09/18/2022  Medication Sig   DULoxetine (CYMBALTA) 30 MG capsule Take 30 mg by mouth 2 (two) times daily.   omeprazole (PRILOSEC) 40 MG capsule Take 1 capsule (40 mg total) by mouth daily.   phentermine (ADIPEX-P) 37.5 MG tablet Take 1 tablet (37.5 mg total) by mouth daily before breakfast.   No facility-administered encounter medications on file as of 09/18/2022.    Past Medical History:  Diagnosis Date   Anxiety    Depression    Medical history non-contributory    Mental disorder     Past Surgical History:  Procedure Laterality Date   WISDOM TOOTH EXTRACTION      Family History  Problem Relation Age of Onset   Cancer Maternal Grandmother    Other Maternal Grandfather        heart issues   Cancer Maternal Grandfather    Diabetes Father    Kidney Stones Mother    Other Brother        passed away soon after birth   Kidney Stones Sister    Seizures Sister     Social History   Socioeconomic History   Marital status: Married    Spouse name: Not on file   Number of children: 1   Years of education: Not on file   Highest education level: Not on file  Occupational History   Occupation: CNA  Tobacco Use   Smoking status: Never   Smokeless tobacco: Never  Vaping Use   Vaping status: Never Used  Substance and Sexual Activity   Alcohol use: No   Drug use: No   Sexual activity: Yes    Birth control/protection: Condom  Other Topics Concern   Not on file  Social History Narrative   ** Merged History Encounter **       Social Determinants of Health   Financial Resource Strain: Low Risk  (07/25/2020)   Overall Financial Resource Strain (CARDIA)    Difficulty of Paying Living Expenses: Not hard at all  Food Insecurity:  No Food Insecurity (07/25/2020)   Hunger Vital Sign    Worried About Running Out of Food in the Last Year: Never true    Ran Out of Food in the Last Year: Never true  Transportation Needs: No Transportation Needs (07/25/2020)   PRAPARE - Administrator, Civil Service (Medical): No    Lack of Transportation (Non-Medical): No  Physical Activity: Inactive (07/25/2020)   Exercise Vital Sign    Days of Exercise per Week: 0 days    Minutes of Exercise per Session: 0 min  Stress: Stress Concern Present (07/25/2020)   [employer] of Occupational Health - Occupational Stress Questionnaire    Feeling of Stress : Rather much  Social Connections: Moderately Integrated (07/25/2020)   Social Connection and Isolation Panel [NHANES]    Frequency of Communication with Friends and Family: More than three times a week    Frequency of Social Gatherings with Friends and Family: More than three times a week    Attends Religious Services: 1 to 4 times per year    Active Member of [employer] or Organizations: No    Attends Banker Meetings: Never    Marital Status: Married  Intimate Partner Violence: Not At Risk (07/25/2020)   Humiliation, Afraid, Rape, and Kick questionnaire    Fear of Current or Ex-Partner: No    Emotionally Abused: No    Physically Abused: No    Sexually Abused: No    ROS Negative unless indicated in HPI   Objective    There were no vitals taken for this visit.  Physical Exam  {Labs (Optional):23779}    Assessment & Plan:  There are no diagnoses linked to this encounter.   Continue healthy lifestyle choices, including diet (rich in fruits, vegetables, and lean proteins, and low in salt and simple carbohydrates) and exercise (at least 30 minutes of moderate physical activity daily).     The above assessment and management plan was discussed with the patient. The patient verbalized understanding of and has agreed to the management plan. Patient is aware  to call the clinic if they develop any new symptoms or if symptoms persist or worsen. Patient is aware when to return to the clinic for a follow-up visit. Patient educated on when it is appropriate to go to the emergency department.  No follow-ups on file.   Arrie Aran Santa Lighter, DNP [facility] [address] [place], [place] [address] [phone]

## 2022-09-18 ENCOUNTER — Encounter: Payer: Self-pay | Admitting: Nurse Practitioner

## 2022-09-18 ENCOUNTER — Ambulatory Visit: Payer: 59 | Admitting: Nurse Practitioner

## 2022-09-18 VITALS — BP 114/68 | HR 84 | Temp 98.2°F | Ht 64.0 in | Wt 149.4 lb

## 2022-09-18 DIAGNOSIS — Z833 Family history of diabetes mellitus: Secondary | ICD-10-CM | POA: Diagnosis not present

## 2022-09-18 DIAGNOSIS — Z6825 Body mass index (BMI) 25.0-25.9, adult: Secondary | ICD-10-CM

## 2022-09-18 DIAGNOSIS — L700 Acne vulgaris: Secondary | ICD-10-CM | POA: Insufficient documentation

## 2022-09-18 DIAGNOSIS — Z0000 Encounter for general adult medical examination without abnormal findings: Secondary | ICD-10-CM

## 2022-09-18 DIAGNOSIS — Z8041 Family history of malignant neoplasm of ovary: Secondary | ICD-10-CM

## 2022-09-18 DIAGNOSIS — Z803 Family history of malignant neoplasm of breast: Secondary | ICD-10-CM | POA: Diagnosis not present

## 2022-09-18 DIAGNOSIS — Z0001 Encounter for general adult medical examination with abnormal findings: Secondary | ICD-10-CM

## 2022-09-18 LAB — PREGNANCY, URINE: Preg Test, Ur: NEGATIVE

## 2022-09-18 LAB — LIPID PANEL

## 2022-09-19 LAB — CMP14+EGFR
ALT: 14 IU/L (ref 0–32)
AST: 19 IU/L (ref 0–40)
Albumin: 4.6 g/dL (ref 4.0–5.0)
Alkaline Phosphatase: 72 IU/L (ref 44–121)
BUN/Creatinine Ratio: 13 (ref 9–23)
BUN: 8 mg/dL (ref 6–20)
Bilirubin Total: 0.2 mg/dL (ref 0.0–1.2)
CO2: 25 mmol/L (ref 20–29)
Calcium: 9.4 mg/dL (ref 8.7–10.2)
Chloride: 99 mmol/L (ref 96–106)
Creatinine, Ser: 0.63 mg/dL (ref 0.57–1.00)
Globulin, Total: 2.5 g/dL (ref 1.5–4.5)
Glucose: 110 mg/dL — ABNORMAL HIGH (ref 70–99)
Potassium: 4.1 mmol/L (ref 3.5–5.2)
Sodium: 140 mmol/L (ref 134–144)
Total Protein: 7.1 g/dL (ref 6.0–8.5)
eGFR: 124 mL/min/{1.73_m2} (ref >59)

## 2022-09-19 LAB — THYROID PANEL WITH TSH
Free Thyroxine Index: 2.5 (ref 1.2–4.9)
T3 Uptake Ratio: 32 % (ref 24–39)
T4, Total: 7.7 ug/dL (ref 4.5–12.0)
TSH: 1.09 u[IU]/mL (ref 0.450–4.500)

## 2022-09-19 LAB — CBC WITH DIFFERENTIAL/PLATELET
Basophils Absolute: 0 10*3/uL (ref 0.0–0.2)
Basos: 0 %
EOS (ABSOLUTE): 0.1 10*3/uL (ref 0.0–0.4)
Eos: 2 %
Hematocrit: 38.2 % (ref 34.0–46.6)
Hemoglobin: 13.1 g/dL (ref 11.1–15.9)
Immature Grans (Abs): 0 10*3/uL (ref 0.0–0.1)
Immature Granulocytes: 0 %
Lymphocytes Absolute: 2.8 10*3/uL (ref 0.7–3.1)
Lymphs: 42 %
MCH: 30.3 pg (ref 26.6–33.0)
MCHC: 34.3 g/dL (ref 31.5–35.7)
MCV: 88 fL (ref 79–97)
Monocytes Absolute: 0.4 10*3/uL (ref 0.1–0.9)
Monocytes: 6 %
Neutrophils Absolute: 3.3 10*3/uL (ref 1.4–7.0)
Neutrophils: 50 %
Platelets: 306 10*3/uL (ref 150–450)
RBC: 4.33 x10E6/uL (ref 3.77–5.28)
RDW: 11.9 % (ref 11.7–15.4)
WBC: 6.7 10*3/uL (ref 3.4–10.8)

## 2022-09-19 LAB — LIPID PANEL
Chol/HDL Ratio: 3.9 ratio (ref 0.0–4.4)
Cholesterol, Total: 157 mg/dL (ref 100–199)
HDL: 40 mg/dL (ref >39)
LDL Chol Calc (NIH): 89 mg/dL (ref 0–99)
Triglycerides: 159 mg/dL — ABNORMAL HIGH (ref 0–149)
VLDL Cholesterol Cal: 28 mg/dL (ref 5–40)

## 2022-09-25 ENCOUNTER — Encounter: Payer: 59 | Admitting: Nurse Practitioner

## 2022-10-21 DIAGNOSIS — L739 Follicular disorder, unspecified: Secondary | ICD-10-CM | POA: Diagnosis not present

## 2022-10-21 DIAGNOSIS — L700 Acne vulgaris: Secondary | ICD-10-CM | POA: Diagnosis not present

## 2022-10-21 DIAGNOSIS — Z79899 Other long term (current) drug therapy: Secondary | ICD-10-CM | POA: Diagnosis not present

## 2022-10-30 ENCOUNTER — Encounter: Payer: Self-pay | Admitting: Nurse Practitioner

## 2022-10-30 ENCOUNTER — Ambulatory Visit (INDEPENDENT_AMBULATORY_CARE_PROVIDER_SITE_OTHER): Payer: 59 | Admitting: Nurse Practitioner

## 2022-10-30 VITALS — BP 100/70 | HR 74 | Temp 97.5°F | Resp 20 | Ht 64.0 in | Wt 148.1 lb

## 2022-10-30 DIAGNOSIS — R051 Acute cough: Secondary | ICD-10-CM | POA: Diagnosis not present

## 2022-10-30 DIAGNOSIS — R0981 Nasal congestion: Secondary | ICD-10-CM | POA: Diagnosis not present

## 2022-10-30 DIAGNOSIS — L0291 Cutaneous abscess, unspecified: Secondary | ICD-10-CM | POA: Diagnosis not present

## 2022-10-30 MED ORDER — FLUTICASONE PROPIONATE 50 MCG/ACT NA SUSP: 2.0000 | Freq: Every day | NASAL | 6 refills | Status: DC

## 2022-10-30 MED ORDER — AZITHROMYCIN 250 MG PO TABS: ORAL_TABLET | ORAL | 0 refills | Status: DC

## 2022-10-30 MED ORDER — BENZONATATE 100 MG PO CAPS: 100.0000 mg | ORAL_CAPSULE | Freq: Three times a day (TID) | ORAL | 0 refills | Status: DC | PRN

## 2022-10-30 NOTE — Progress Notes (Signed)
Established Patient Office Visit  Subjective   Patient ID: Jacqueline Johnson, female    DOB: April 08, 1995  Age: 27 y.o. MRN: [id number]  Chief Complaint  Patient presents with   Cough    Cough Associated symptoms include a sore throat. Pertinent negatives include no chest pain, chills, fever, headaches, myalgias or rash. There is no history of environmental allergies.   Jacqueline Johnson is a 27 yrs old female seen today fro cough and nasal congestion for 3-weeks. Cough: Patient complains of nasal congestion, productive cough with sputum described as yellow, and sore throat.  Symptoms began 3 weeks ago.  The cough is productive of green/yellow sputum, worsening over time and is aggravated by nothing Associated symptoms include:change in voice. Patient does have new pets. Patient does not have a history of asthma. Patient does not have a history of environmental allergens. Patient does not have recent travel. Patient does not have a history of smoking. Patient  does not have previous Chest X-ray. Patient  LMP 10/09/2022  Patient Active Problem List   Diagnosis Date Noted   Acute cough 10/30/2022   Routine medical exam 09/18/2022   Family history of diabetes mellitus in father 09/18/2022   Family history of ovarian cancer 09/18/2022   Family history of breast cancer 09/18/2022   Acne vulgaris 09/18/2022   BMI 25.0-25.9,adult 09/18/2022   Encounter for well woman exam with routine gynecological exam 07/25/2020   Irregular periods 07/25/2020   Anxiety and depression 07/25/2020   Stress 07/25/2020   Past Medical History:  Diagnosis Date   Anxiety    Depression    Medical history non-contributory    Mental disorder    Past Surgical History:  Procedure Laterality Date   WISDOM TOOTH EXTRACTION     Social History   Tobacco Use   Smoking status: Never   Smokeless tobacco: Never  Vaping Use   Vaping status: Never Used  Substance Use Topics   Alcohol use: No   Drug use: No   Social History    Socioeconomic History   Marital status: Married    Spouse name: Not on file   Number of children: 1   Years of education: Not on file   Highest education level: Not on file  Occupational History   Occupation: CNA  Tobacco Use   Smoking status: Never   Smokeless tobacco: Never  Vaping Use   Vaping status: Never Used  Substance and Sexual Activity   Alcohol use: No   Drug use: No   Sexual activity: Yes    Birth control/protection: Condom  Other Topics Concern   Not on file  Social History Narrative   ** Merged History Encounter **       Social Determinants of Health   Financial Resource Strain: Low Risk  (07/25/2020)   Overall Financial Resource Strain (CARDIA)    Difficulty of Paying Living Expenses: Not hard at all  Food Insecurity: No Food Insecurity (09/18/2022)   Hunger Vital Sign    Worried About Running Out of Food in the Last Year: Never true    Ran Out of Food in the Last Year: Never true  Transportation Needs: No Transportation Needs (07/25/2020)   PRAPARE - Administrator, Civil Service (Medical): No    Lack of Transportation (Non-Medical): No  Physical Activity: Inactive (07/25/2020)   Exercise Vital Sign    Days of Exercise per Week: 0 days    Minutes of Exercise per Session: 0 min  Stress: Stress  Concern Present (07/25/2020)   [employer] of Occupational Health - Occupational Stress Questionnaire    Feeling of Stress : Rather much  Social Connections: Moderately Integrated (07/25/2020)   Social Connection and Isolation Panel [NHANES]    Frequency of Communication with Friends and Family: More than three times a week    Frequency of Social Gatherings with Friends and Family: More than three times a week    Attends Religious Services: 1 to 4 times per year    Active Member of [employer] or Organizations: No    Attends Banker Meetings: Never    Marital Status: Married  Catering manager Violence: Not At Risk (07/25/2020)    Humiliation, Afraid, Rape, and Kick questionnaire    Fear of Current or Ex-Partner: No    Emotionally Abused: No    Physically Abused: No    Sexually Abused: No   Family Status  Relation Name Status   PGF  Deceased   PGM  Deceased   MGM  Deceased   MGF  Deceased   Father  Alive   Mother  Alive   Brother half Alive   Brother half Alive   Brother half Deceased   Sister half Alive   Sister half Alive   Sister half Alive   Daughter [employer] Alive   Mat Aunt  Alive   Mat Uncle  Alive   Pat Aunt  Alive   Pat Uncle  Alive   Son ELI Alive  No partnership data on file   Family History  Problem Relation Age of Onset   Cancer Maternal Grandmother    Other Maternal Grandfather        heart issues   Cancer Maternal Grandfather    Diabetes Father    Kidney Stones Mother    Other Brother        passed away soon after birth   Kidney Stones Sister    Seizures Sister    Allergies  Allergen Reactions   Icy Hot Rash    Ingredient in icy hot cause rash      Review of Systems  Constitutional:  Negative for chills and fever.  HENT:  Positive for congestion and sore throat. Negative for tinnitus.   Eyes:  Negative for double vision and pain.  Respiratory:  Positive for cough.   Cardiovascular:  Positive for leg swelling. Negative for chest pain.  Gastrointestinal:  Negative for blood in stool, constipation, nausea and vomiting.  Genitourinary:  Negative for frequency, hematuria and urgency.  Musculoskeletal:  Negative for myalgias.  Skin:  Negative for itching and rash.  Neurological:  Negative for dizziness, weakness and headaches.  Endo/Heme/Allergies:  Negative for environmental allergies and polydipsia. Does not bruise/bleed easily.  Psychiatric/Behavioral:  Negative for substance abuse and suicidal ideas.    Negative unless indicated in HPI   Objective:     BP 100/70   Pulse 74   Temp (!) 97.5 F (36.4 C) (Oral)   Resp 20   Ht 5\' 4"  (1.626 m)   Wt 148 lb 2 oz (67.2  kg)   SpO2 96%   BMI 25.43 kg/m  BP Readings from Last 3 Encounters:  10/30/22 100/70  09/18/22 114/68  07/25/20 111/72   Wt Readings from Last 3 Encounters:  10/30/22 148 lb 2 oz (67.2 kg)  09/18/22 149 lb 6.4 oz (67.8 kg)  07/25/20 175 lb (79.4 kg)      Physical Exam Vitals and nursing note reviewed.  HENT:  Head: Normocephalic and atraumatic.     Nose: Congestion and rhinorrhea present.     Right Sinus: No maxillary sinus tenderness or frontal sinus tenderness.     Left Sinus: No maxillary sinus tenderness or frontal sinus tenderness.  Cardiovascular:     Rate and Rhythm: Normal rate and regular rhythm.  Pulmonary:     Effort: Pulmonary effort is normal.     Breath sounds: Normal breath sounds.  Skin:    General: Skin is warm and dry.     Coloration: Skin is not jaundiced.     Findings: No rash.  Neurological:     Mental Status: She is oriented to person, place, and time. Mental status is at baseline.  Psychiatric:        Mood and Affect: Mood normal.        Behavior: Behavior normal.        Thought Content: Thought content normal.        Judgment: Judgment normal.     No results found for any visits on 10/30/22.  Last CBC Lab Results  Component Value Date   WBC 6.7 09/18/2022   HGB 13.1 09/18/2022   HCT 38.2 09/18/2022   MCV 88 09/18/2022   MCH 30.3 09/18/2022   RDW 11.9 09/18/2022   PLT 306 09/18/2022   Last metabolic panel Lab Results  Component Value Date   GLUCOSE 110 (H) 09/18/2022   NA 140 09/18/2022   K 4.1 09/18/2022   CL 99 09/18/2022   CO2 25 09/18/2022   BUN 8 09/18/2022   CREATININE 0.63 09/18/2022   EGFR 124 09/18/2022   CALCIUM 9.4 09/18/2022   PROT 7.1 09/18/2022   ALBUMIN 4.6 09/18/2022   LABGLOB 2.5 09/18/2022   BILITOT 0.2 09/18/2022   ALKPHOS 72 09/18/2022   AST 19 09/18/2022   ALT 14 09/18/2022   ANIONGAP 11 11/06/2015   Last lipids Lab Results  Component Value Date   CHOL 157 09/18/2022   HDL 40 09/18/2022    LDLCALC 89 09/18/2022   TRIG 159 (H) 09/18/2022   CHOLHDL 3.9 09/18/2022   Last hemoglobin A1c No results found for: "HGBA1C" Last thyroid functions Lab Results  Component Value Date   TSH 1.090 09/18/2022   T4TOTAL 7.7 09/18/2022        Assessment & Plan:  Acute cough -     Azithromycin; 2-tabs on day 1 and 1-tab daily until done  Dispense: 6 each; Refill: 0 -     Benzonatate; Take 1 capsule (100 mg total) by mouth 3 (three) times daily as needed for cough.  Dispense: 20 capsule; Refill: 0  Nasal congestion -     Fluticasone Propionate; Place 2 sprays into both nostrils daily.  Dispense: 16 g; Refill: 6   Kye is 27 yrs old Caucasian female, no acute distress Cough: Z-pack, Tessalon Pearls Congestion Flonase Increase Hydration, rest  Encourage healthy lifestyle choices, including diet (rich in fruits, vegetables, and lean proteins, and low in salt and simple carbohydrates) and exercise (at least 30 minutes of moderate physical activity daily).     The above assessment and management plan was discussed with the patient. The patient verbalized understanding of and has agreed to the management plan. Patient is aware to call the clinic if they develop any new symptoms or if symptoms persist or worsen. Patient is aware when to return to the clinic for a follow-up visit. Patient educated on when it is appropriate to go to the emergency department.  Return  if symptoms worsen or fail to improve.    Arrie Aran Santa Lighter, DNP [facility] [address] [place], [place] [address] [phone]

## 2022-11-27 DIAGNOSIS — Z79899 Other long term (current) drug therapy: Secondary | ICD-10-CM | POA: Diagnosis not present

## 2022-11-27 DIAGNOSIS — L700 Acne vulgaris: Secondary | ICD-10-CM | POA: Diagnosis not present

## 2022-11-27 DIAGNOSIS — L739 Follicular disorder, unspecified: Secondary | ICD-10-CM | POA: Diagnosis not present

## 2022-12-29 DIAGNOSIS — Z79899 Other long term (current) drug therapy: Secondary | ICD-10-CM | POA: Diagnosis not present

## 2022-12-29 DIAGNOSIS — L700 Acne vulgaris: Secondary | ICD-10-CM | POA: Diagnosis not present

## 2022-12-29 DIAGNOSIS — L739 Follicular disorder, unspecified: Secondary | ICD-10-CM | POA: Diagnosis not present

## 2023-09-15 ENCOUNTER — Telehealth: Payer: Self-pay

## 2023-09-15 NOTE — Telephone Encounter (Signed)
 Copied from CRM [id number]. Topic: Appointments - Scheduling Inquiry for Clinic >> Sep 15, 2023 11:23 AM Gustabo D wrote: Patient needs a physical before the deadline in September she wants to know if someone can call her back to get her seen. She says she works for [employer] and it's required.

## 2023-09-15 NOTE — Telephone Encounter (Signed)
 Appt scheduled for 09/23/2023

## 2023-09-17 ENCOUNTER — Ambulatory Visit: Admitting: Nurse Practitioner

## 2023-09-23 ENCOUNTER — Ambulatory Visit

## 2023-09-23 ENCOUNTER — Encounter: Payer: Self-pay | Admitting: Family Medicine

## 2023-09-23 ENCOUNTER — Telehealth: Payer: Self-pay | Admitting: Nurse Practitioner

## 2023-09-23 ENCOUNTER — Ambulatory Visit: Admitting: Family Medicine

## 2023-09-23 VITALS — BP 107/68 | HR 73 | Temp 97.8°F | Ht 64.0 in | Wt 160.6 lb

## 2023-09-23 DIAGNOSIS — Z1329 Encounter for screening for other suspected endocrine disorder: Secondary | ICD-10-CM

## 2023-09-23 DIAGNOSIS — Z130 Encounter for screening for diseases of the blood and blood-forming organs and certain disorders involving the immune mechanism: Secondary | ICD-10-CM | POA: Diagnosis not present

## 2023-09-23 DIAGNOSIS — Z0001 Encounter for general adult medical examination with abnormal findings: Secondary | ICD-10-CM | POA: Diagnosis not present

## 2023-09-23 DIAGNOSIS — Z13220 Encounter for screening for lipoid disorders: Secondary | ICD-10-CM

## 2023-09-23 DIAGNOSIS — Z13228 Encounter for screening for other metabolic disorders: Secondary | ICD-10-CM | POA: Diagnosis not present

## 2023-09-23 DIAGNOSIS — Z0000 Encounter for general adult medical examination without abnormal findings: Secondary | ICD-10-CM

## 2023-09-23 DIAGNOSIS — E663 Overweight: Secondary | ICD-10-CM

## 2023-09-23 LAB — LIPID PANEL

## 2023-09-23 LAB — BAYER DCA HB A1C WAIVED: HB A1C (BAYER DCA - WAIVED): 4.9 % (ref 4.8–5.6)

## 2023-09-23 NOTE — Telephone Encounter (Signed)
 I'm ok with switch since I have already seen patient and she doesn't have any chronic illness. We can count her appt today as her establish care appt and she can follow up with me in 1 year for a CPE as discussed at visit if ok with Nena.

## 2023-09-23 NOTE — Progress Notes (Signed)
 Complete physical exam  Patient: Jacqueline Johnson   DOB: [birth year]   28 y.o. Female  MRN: [id number]  Subjective:    Chief Complaint  Patient presents with   Annual Exam    Jacqueline Johnson is a 28 y.o. female who presents today for a complete physical exam. She reports consuming a general diet. Home exercise routine includes walking 2 miles daily. She generally feels well. She reports sleeping well. She does not have additional problems to discuss today.    Most recent fall risk assessment:    07/25/2020    3:35 PM  Fall Risk   Falls in the past year? 0  Number falls in past yr: 0  Injury with Fall? 0  Risk for fall due to : No Fall Risks  Follow up Falls evaluation completed      Data saved with a previous flowsheet row definition     Most recent depression screenings:    10/30/2022    8:39 AM 09/18/2022    4:08 PM  PHQ 2/9 Scores  PHQ - 2 Score 0 0  PHQ- 9 Score 3 0    Vision:Within last year and Dental: No current dental problems and Receives regular dental care    Patient Care Team: Deitra Morton Hummer, Nena, NP as PCP - General (Nurse Practitioner) Patient, No Pcp Per (General Practice)   Outpatient Medications Prior to Visit  Medication Sig   [DISCONTINUED] azithromycin  (ZITHROMAX  Z-PAK) 250 MG tablet 2-tabs on day 1 and 1-tab daily until done   [DISCONTINUED] benzonatate  (TESSALON  PERLES) 100 MG capsule Take 1 capsule (100 mg total) by mouth 3 (three) times daily as needed for cough.   [DISCONTINUED] fluticasone  (FLONASE ) 50 MCG/ACT nasal spray Place 2 sprays into both nostrils daily.   [DISCONTINUED] ISOtretinoin (ACCUTANE) 40 MG capsule Take 60 mg by mouth 2 (two) times daily.   No facility-administered medications prior to visit.    ROS Negative unless specially indicated above in HPI.     Objective:     BP 107/68   Pulse 73   Temp 97.8 F (36.6 C) (Temporal)   Ht 5' 4 (1.626 m)   Wt 160 lb 9.6 oz (72.8 kg)   SpO2 98%   BMI 27.57 kg/m     Physical Exam Vitals and nursing note reviewed.  Constitutional:      General: She is not in acute distress.    Appearance: Normal appearance. She is not ill-appearing, toxic-appearing or diaphoretic.  HENT:     Head: Normocephalic.     Right Ear: Tympanic membrane, ear canal and external ear normal.     Left Ear: Tympanic membrane, ear canal and external ear normal.     Nose: Nose normal.     Mouth/Throat:     Mouth: Mucous membranes are moist.     Pharynx: Oropharynx is clear.  Eyes:     Extraocular Movements: Extraocular movements intact.     Conjunctiva/sclera: Conjunctivae normal.     Pupils: Pupils are equal, round, and reactive to light.  Neck:     Thyroid : No thyroid  mass, thyromegaly or thyroid  tenderness.  Cardiovascular:     Rate and Rhythm: Normal rate and regular rhythm.     Pulses: Normal pulses.     Heart sounds: Normal heart sounds. No murmur heard.    No friction rub. No gallop.  Pulmonary:     Effort: Pulmonary effort is normal.     Breath sounds: Normal breath sounds.  Abdominal:  General: Bowel sounds are normal. There is no distension.     Palpations: Abdomen is soft. There is no mass.     Tenderness: There is no abdominal tenderness. There is no guarding.  Musculoskeletal:     Cervical back: Normal range of motion and neck supple. No tenderness.     Right lower leg: No edema.     Left lower leg: No edema.  Skin:    General: Skin is warm and dry.     Capillary Refill: Capillary refill takes less than 2 seconds.     Findings: No lesion or rash.  Neurological:     General: No focal deficit present.     Mental Status: She is alert and oriented to person, place, and time.     Cranial Nerves: No cranial nerve deficit.     Motor: No weakness.     Gait: Gait normal.  Psychiatric:        Mood and Affect: Mood normal.        Behavior: Behavior normal.        Thought Content: Thought content normal.        Judgment: Judgment normal.      No  results found for any visits on 09/23/23.     Assessment & Plan:    Routine Health Maintenance and Physical Exam  Jacqueline Johnson was seen today for annual exam.  Diagnoses and all orders for this visit:  Routine general medical examination at a health care facility  Overweight -     Bayer DCA Hb A1c Waived  Screening for endocrine, metabolic and immunity disorder -     CBC with Differential/Platelet -     CMP14+EGFR -     TSH  Encounter for screening for lipid disorder -     Lipid panel    Immunization History  Administered Date(s) Administered   DTaP 09/26/1994, 12/02/1994, 01/30/1995, 04/07/1996, 08/29/1999   HIB (PRP-T) 09/26/1994, 12/02/1994, 01/30/1995, 04/07/1996   Hep A / Hep B 08/09/2019   Hepatitis B, PED/ADOLESCENT 05/01/1995, 08/28/1995, 09/21/1996   IPV 09/26/1994, 12/02/1994, 01/30/1995, 04/07/1996   Influenza,inj,Quad PF,6+ Mos 10/21/2019, 10/03/2020   Influenza-Unspecified 12/14/2018   MMR 04/07/1996, 08/29/1999, 08/09/2019   PFIZER Comirnaty(Gray Top)Covid-19 Tri-Sucrose Vaccine 08/28/2019   PPD Test 04/13/2013, 11/25/2013, 12/04/2013, 08/09/2019   Rho (D) Immune Globulin  05/09/2016, 09/20/2018   Td (Adult),5 Lf Tetanus Toxid, Preservative Free 08/09/2019   Tdap 03/04/2016, 09/13/2018    Health Maintenance  Topic Date Due   Hepatitis C Screening  Never done   HPV VACCINES (1 - 3-dose SCDM series) Never done   Cervical Cancer Screening (Pap smear)  07/19/2023   INFLUENZA VACCINE  04/26/2024 (Originally 08/28/2023)   COVID-19 Vaccine (2 - Pfizer risk series) 10/08/2024 (Originally 09/18/2019)   DTaP/Tdap/Td (9 - Td or Tdap) 08/08/2029   Hepatitis B Vaccines 19-59 Average Risk  Completed   HIV Screening  Completed   Pneumococcal Vaccine  Aged Out   Meningococcal B Vaccine  Aged Out    Discussed health benefits of physical activity, and encouraged her to engage in regular exercise appropriate for her age and condition.  Problem List Items Addressed This Visit    None Visit Diagnoses       Routine general medical examination at a health care facility    -  Primary     Overweight       Relevant Orders   Bayer DCA Hb A1c Waived     Screening for endocrine, metabolic and immunity disorder  Relevant Orders   CBC with Differential/Platelet   CMP14+EGFR   TSH     Encounter for screening for lipid disorder       Relevant Orders   Lipid panel      Return in 1 year (on 09/22/2024).  The patient indicates understanding of these issues and agrees with the plan.   Jacqueline CHRISTELLA Search, FNP

## 2023-09-23 NOTE — Patient Instructions (Signed)

## 2023-09-23 NOTE — Telephone Encounter (Signed)
 PT wants to switch providers. She would like to start seeing Tiffany.

## 2023-09-24 ENCOUNTER — Ambulatory Visit: Payer: Self-pay | Admitting: Family Medicine

## 2023-09-24 LAB — CBC WITH DIFFERENTIAL/PLATELET
Basophils Absolute: 0 x10E3/uL (ref 0.0–0.2)
Basos: 1 %
EOS (ABSOLUTE): 0.1 x10E3/uL (ref 0.0–0.4)
Eos: 1 %
Hematocrit: 39.4 % (ref 34.0–46.6)
Hemoglobin: 13.2 g/dL (ref 11.1–15.9)
Immature Grans (Abs): 0 x10E3/uL (ref 0.0–0.1)
Immature Granulocytes: 0 %
Lymphocytes Absolute: 1.6 x10E3/uL (ref 0.7–3.1)
Lymphs: 36 %
MCH: 30.9 pg (ref 26.6–33.0)
MCHC: 33.5 g/dL (ref 31.5–35.7)
MCV: 92 fL (ref 79–97)
Monocytes Absolute: 0.4 x10E3/uL (ref 0.1–0.9)
Monocytes: 8 %
Neutrophils Absolute: 2.5 x10E3/uL (ref 1.4–7.0)
Neutrophils: 54 %
Platelets: 228 x10E3/uL (ref 150–450)
RBC: 4.27 x10E6/uL (ref 3.77–5.28)
RDW: 12.1 % (ref 11.7–15.4)
WBC: 4.5 x10E3/uL (ref 3.4–10.8)

## 2023-09-24 LAB — CMP14+EGFR
ALT: 13 IU/L (ref 0–32)
AST: 16 IU/L (ref 0–40)
Albumin: 4.5 g/dL (ref 4.0–5.0)
Alkaline Phosphatase: 57 IU/L (ref 44–121)
BUN/Creatinine Ratio: 15 (ref 9–23)
BUN: 10 mg/dL (ref 6–20)
Bilirubin Total: 0.3 mg/dL (ref 0.0–1.2)
CO2: 23 mmol/L (ref 20–29)
Calcium: 9.7 mg/dL (ref 8.7–10.2)
Chloride: 102 mmol/L (ref 96–106)
Creatinine, Ser: 0.68 mg/dL (ref 0.57–1.00)
Globulin, Total: 2.3 g/dL (ref 1.5–4.5)
Glucose: 126 mg/dL — AB (ref 70–99)
Potassium: 4.8 mmol/L (ref 3.5–5.2)
Sodium: 141 mmol/L (ref 134–144)
Total Protein: 6.8 g/dL (ref 6.0–8.5)
eGFR: 121 mL/min/1.73 (ref >59)

## 2023-09-24 LAB — LIPID PANEL
Cholesterol, Total: 134 mg/dL (ref 100–199)
HDL: 44 mg/dL (ref >39)
LDL CALC COMMENT:: 3 ratio (ref 0.0–4.4)
LDL Chol Calc (NIH): 76 mg/dL (ref 0–99)
Triglycerides: 67 mg/dL (ref 0–149)
VLDL Cholesterol Cal: 14 mg/dL (ref 5–40)

## 2023-09-24 LAB — TSH: TSH: 1.26 u[IU]/mL (ref 0.450–4.500)
